# Patient Record
Sex: Female | Born: 1980 | Race: White | Hispanic: No | Marital: Married | Smoking: Former smoker
Health system: Southern US, Community
[De-identification: ages and names within clinical notes are randomized; demographics above are authoritative.]

## PROBLEM LIST (undated history)

## (undated) DIAGNOSIS — D649 Anemia, unspecified: Secondary | ICD-10-CM

## (undated) HISTORY — PX: TUBAL LIGATION: SHX77

---

## 2011-02-22 ENCOUNTER — Inpatient Hospital Stay (INDEPENDENT_AMBULATORY_CARE_PROVIDER_SITE_OTHER)
Admission: RE | Admit: 2011-02-22 | Discharge: 2011-02-22 | Disposition: A | Discharge status: Home / Self Care | Payer: Medicaid Other | Source: Ambulatory Visit | Attending: Family Medicine | Admitting: Family Medicine

## 2011-02-22 DIAGNOSIS — N390 Urinary tract infection, site not specified: Secondary | ICD-10-CM

## 2011-02-22 DIAGNOSIS — N760 Acute vaginitis: Secondary | ICD-10-CM

## 2011-02-22 LAB — WET PREP, GENITAL: Yeast Wet Prep HPF POC: NONE SEEN

## 2011-02-22 LAB — POCT PREGNANCY, URINE: Preg Test, Ur: NEGATIVE

## 2011-02-22 LAB — POCT URINALYSIS DIP (DEVICE)
Glucose, UA: 100 mg/dL — AB
Protein, ur: 30 mg/dL — AB
Urobilinogen, UA: 1 mg/dL (ref 0.0–1.0)

## 2013-04-08 ENCOUNTER — Encounter (HOSPITAL_COMMUNITY): Payer: Self-pay | Admitting: Emergency Medicine

## 2013-04-08 ENCOUNTER — Emergency Department (INDEPENDENT_AMBULATORY_CARE_PROVIDER_SITE_OTHER)
Admission: EM | Admit: 2013-04-08 | Discharge: 2013-04-08 | Disposition: A | Discharge status: Home / Self Care | Payer: Self-pay | Source: Home / Self Care

## 2013-04-08 DIAGNOSIS — K0889 Other specified disorders of teeth and supporting structures: Secondary | ICD-10-CM

## 2013-04-08 DIAGNOSIS — K089 Disorder of teeth and supporting structures, unspecified: Secondary | ICD-10-CM

## 2013-04-08 MED ORDER — CLINDAMYCIN HCL 300 MG PO CAPS: 300.0000 mg | ORAL_CAPSULE | Freq: Three times a day (TID) | ORAL | Status: DC

## 2013-04-08 MED ORDER — DICLOFENAC POTASSIUM 50 MG PO TABS: 50.0000 mg | ORAL_TABLET | Freq: Three times a day (TID) | ORAL | Status: DC

## 2013-04-08 NOTE — ED Provider Notes (Signed)
CSN: [id number]     Arrival date & time 04/08/13  1449 History   None    Chief Complaint  Patient presents with  . Jaw Pain   (Consider location/radiation/quality/duration/timing/severity/associated sxs/prior Treatment) Patient is a 32 y.o. female presenting with tooth pain. The history is provided by the patient.  Dental Pain Location:  Upper Upper teeth location:  14/LU 1st molar Quality:  Throbbing Severity:  Moderate Onset quality:  Sudden Duration:  3 years Progression:  Worsening Chronicity:  Recurrent Context: dental fracture   Associated symptoms: facial pain   Associated symptoms: no fever   Risk factors: lack of dental care     History reviewed. No pertinent past medical history. History reviewed. No pertinent past surgical history. History reviewed. No pertinent family history. History  Substance Use Topics  . Smoking status: Never Smoker   . Smokeless tobacco: Not on file  . Alcohol Use: No   OB History   Grav Para Term Preterm Abortions TAB SAB Ect Mult Living                 Review of Systems  Constitutional: Negative.  Negative for fever.  HENT: Positive for dental problem and ear pain.     Allergies  Review of patient's allergies indicates no known allergies.  Home Medications   Current Outpatient Rx  Name  Route  Sig  Dispense  Refill  . clindamycin (CLEOCIN) 300 MG capsule   Oral   Take 1 capsule (300 mg total) by mouth 3 (three) times daily.   21 capsule   0   . diclofenac (CATAFLAM) 50 MG tablet   Oral   Take 1 tablet (50 mg total) by mouth 3 (three) times daily. For dental pain   21 tablet   0    BP 114/78  Pulse 67  Temp(Src) 99 F (37.2 C) (Oral)  Resp 16  SpO2 100%  LMP 03/13/2013 Physical Exam  Nursing note and vitals reviewed. Constitutional: She is oriented to person, place, and time. She appears well-developed and well-nourished. No distress.  HENT:  Head: Normocephalic.  Right Ear: Tympanic membrane and external  ear normal.  Left Ear: Tympanic membrane and external ear normal.  Mouth/Throat: Oropharynx is clear and moist and mucous membranes are normal.    Eyes: Pupils are equal, round, and reactive to light.  Lymphadenopathy:    She has no cervical adenopathy.  Neurological: She is alert and oriented to person, place, and time.  Skin: Skin is warm and dry.    ED Course  Procedures (including critical care time) Labs Review Labs Reviewed - No data to display Imaging Review No results found.  EKG Interpretation    Date/Time:    Ventricular Rate:    PR Interval:    QRS Duration:   QT Interval:    QTC Calculation:   R Axis:     Text Interpretation:              MDM      Linna Hoff, MD 04/08/13 1657

## 2013-04-08 NOTE — ED Notes (Signed)
C/o left jaw and ear pain, having pain with opening mouth  x 3 yrs.  States 3 yrs ago had abscessed tooth. Pt thinks infection in jaw/bone.   No meds taken for treatment

## 2013-12-16 ENCOUNTER — Encounter (HOSPITAL_COMMUNITY): Payer: Self-pay | Admitting: Emergency Medicine

## 2013-12-16 ENCOUNTER — Emergency Department (HOSPITAL_COMMUNITY)
Admission: EM | Admit: 2013-12-16 | Discharge: 2013-12-16 | Disposition: A | Discharge status: Home / Self Care | Payer: Self-pay | Attending: Emergency Medicine | Admitting: Emergency Medicine

## 2013-12-16 DIAGNOSIS — R110 Nausea: Secondary | ICD-10-CM

## 2013-12-16 DIAGNOSIS — N926 Irregular menstruation, unspecified: Secondary | ICD-10-CM

## 2013-12-16 DIAGNOSIS — Z3202 Encounter for pregnancy test, result negative: Secondary | ICD-10-CM | POA: Insufficient documentation

## 2013-12-16 LAB — URINE MICROSCOPIC-ADD ON

## 2013-12-16 LAB — URINALYSIS, ROUTINE W REFLEX MICROSCOPIC
BILIRUBIN URINE: NEGATIVE
Glucose, UA: NEGATIVE mg/dL
KETONES UR: NEGATIVE mg/dL
Leukocytes, UA: NEGATIVE
NITRITE: NEGATIVE
Protein, ur: NEGATIVE mg/dL
Specific Gravity, Urine: 1.023 (ref 1.005–1.030)
UROBILINOGEN UA: 1 mg/dL (ref 0.0–1.0)
pH: 7.5 (ref 5.0–8.0)

## 2013-12-16 LAB — PREGNANCY, URINE: Preg Test, Ur: NEGATIVE

## 2013-12-16 NOTE — ED Notes (Signed)
Pt refused to ride in wheelchair to room

## 2013-12-16 NOTE — ED Notes (Signed)
Pt in stating she thinks she is pregnant, states she had her tubes tied 10 years ago- her LMP was 6/27- states that on 7/27 she spotted for 1 day, reports lower abdominal distention and pelvic pressure, intermittent nausea. States she had a positive home pregnancy test but wants to make sure it is not tubal.

## 2013-12-16 NOTE — Discharge Instructions (Signed)
Please follow-up with your gynecologist.

## 2013-12-16 NOTE — ED Provider Notes (Addendum)
CSN: [id number]     Arrival date & time 12/16/13  1944 History   First MD Initiated Contact with Patient 12/16/13 2002     Chief Complaint  Patient presents with  . Possible Pregnancy     (Consider location/radiation/quality/duration/timing/severity/associated sxs/prior Treatment) HPI 33 year old G4 P3 A1 LMP ended June presents today saying that she thinks she's pregnant. She states that she had one day of bleeding on Sunday. She feels like she has some breast tenderness, nausea and feels like she has reported she was pregnant. She denies any pain have not normal vaginal discharge or abnormal vaginal bleeding. She states that she took a home pregnancy test and felt like it was borderline positive. Patient states that she had a bilateral tubal ligation 10 years ago but thinks that she is pregnant.  History reviewed. No pertinent past medical history. History reviewed. No pertinent past surgical history. History reviewed. No pertinent family history. History  Substance Use Topics  . Smoking status: Never Smoker   . Smokeless tobacco: Not on file  . Alcohol Use: No   OB History   Grav Para Term Preterm Abortions TAB SAB Ect Mult Living                 Review of Systems  All other systems reviewed and are negative.     Allergies  Review of patient's allergies indicates no known allergies.  Home Medications   Prior to Admission medications   Not on File   BP 114/77  Pulse 77  Temp(Src) 98 F (36.7 C) (Oral)  Resp 20  Wt 120 lb (54.432 kg)  SpO2 100% Physical Exam  Nursing note and vitals reviewed. Constitutional: She is oriented to person, place, and time. She appears well-developed and well-nourished.  HENT:  Head: Normocephalic and atraumatic.  Right Ear: External ear normal.  Left Ear: External ear normal.  Nose: Nose normal.  Mouth/Throat: Oropharynx is clear and moist.  Eyes: Conjunctivae and EOM are normal. Pupils are equal, round, and reactive to light.   Neck: Normal range of motion. Neck supple.  Cardiovascular: Normal rate, regular rhythm, normal heart sounds and intact distal pulses.   Pulmonary/Chest: Effort normal and breath sounds normal.  Abdominal: Soft. Bowel sounds are normal. She exhibits no mass. There is no tenderness.  Musculoskeletal: Normal range of motion.  Neurological: She is alert and oriented to person, place, and time.  Skin: Skin is warm and dry.  Psychiatric: She has a normal mood and affect. Her behavior is normal. Judgment and thought content normal.    ED Course  Procedures (including critical care time) Labs Review Labs Reviewed  URINALYSIS, ROUTINE W REFLEX MICROSCOPIC - Abnormal; Notable for the following:    APPearance TURBID (*)    Hgb urine dipstick TRACE (*)    All other components within normal limits  URINE MICROSCOPIC-ADD ON - Abnormal; Notable for the following:    Squamous Epithelial / LPF FEW (*)    All other components within normal limits  PREGNANCY, URINE    Imaging Review No results found.   EKG Interpretation None      MDM   Final diagnoses:  Nausea  Abnormal menstrual periods    Patient with negative pregnancy test here. Patient advised to followup with gynecologist.    Hilario Quarryanielle S Authur Cubit, MD 12/19/13 1256  Hilario Quarryanielle S Adhya Cocco, MD 12/19/13 1257

## 2013-12-16 NOTE — ED Notes (Signed)
Pt discharged home with all belongings, pt refused wheel chair as originally planned, pt alert, oriented and ambulatory upon discharge. No new rx prescribed, pt driven home by boyfriend at bedside

## 2014-12-07 ENCOUNTER — Emergency Department (HOSPITAL_COMMUNITY)
Admission: EM | Admit: 2014-12-07 | Discharge: 2014-12-07 | Discharge status: Against Medical Advice | Payer: Self-pay | Attending: Emergency Medicine | Admitting: Emergency Medicine

## 2014-12-07 ENCOUNTER — Encounter (HOSPITAL_COMMUNITY): Payer: Self-pay | Admitting: *Deleted

## 2014-12-07 DIAGNOSIS — O9989 Other specified diseases and conditions complicating pregnancy, childbirth and the puerperium: Secondary | ICD-10-CM | POA: Insufficient documentation

## 2014-12-07 DIAGNOSIS — R109 Unspecified abdominal pain: Secondary | ICD-10-CM

## 2014-12-07 DIAGNOSIS — R1030 Lower abdominal pain, unspecified: Secondary | ICD-10-CM | POA: Insufficient documentation

## 2014-12-07 DIAGNOSIS — R140 Abdominal distension (gaseous): Secondary | ICD-10-CM | POA: Insufficient documentation

## 2014-12-07 DIAGNOSIS — Z3A01 Less than 8 weeks gestation of pregnancy: Secondary | ICD-10-CM | POA: Insufficient documentation

## 2014-12-07 LAB — I-STAT BETA HCG BLOOD, ED (MC, WL, AP ONLY): I-stat hCG, quantitative: <5 m[IU]/mL (ref <5)

## 2014-12-07 LAB — CBC
HEMATOCRIT: 34.6 % — AB (ref 36.0–46.0)
HEMOGLOBIN: 12 g/dL (ref 12.0–15.0)
MCH: 33 pg (ref 26.0–34.0)
MCHC: 34.7 g/dL (ref 30.0–36.0)
MCV: 95.1 fL (ref 78.0–100.0)
PLATELETS: 189 10*3/uL (ref 150–400)
RBC: 3.64 MIL/uL — ABNORMAL LOW (ref 3.87–5.11)
RDW: 13 % (ref 11.5–15.5)
WBC: 9.8 10*3/uL (ref 4.0–10.5)

## 2014-12-07 LAB — COMPREHENSIVE METABOLIC PANEL
ALBUMIN: 4.2 g/dL (ref 3.5–5.0)
ALK PHOS: 43 U/L (ref 38–126)
ALT: 16 U/L (ref 14–54)
AST: 21 U/L (ref 15–41)
Anion gap: 9 (ref 5–15)
BILIRUBIN TOTAL: 0.3 mg/dL (ref 0.3–1.2)
BUN: 7 mg/dL (ref 6–20)
CHLORIDE: 102 mmol/L (ref 101–111)
CO2: 24 mmol/L (ref 22–32)
CREATININE: 0.86 mg/dL (ref 0.44–1.00)
Calcium: 9.1 mg/dL (ref 8.9–10.3)
GFR calc non Af Amer: >60 mL/min (ref >60)
Glucose, Bld: 98 mg/dL (ref 65–99)
POTASSIUM: 3.8 mmol/L (ref 3.5–5.1)
SODIUM: 135 mmol/L (ref 135–145)
Total Protein: 6.5 g/dL (ref 6.5–8.1)

## 2014-12-07 LAB — URINALYSIS, ROUTINE W REFLEX MICROSCOPIC
Bilirubin Urine: NEGATIVE
Glucose, UA: NEGATIVE mg/dL
HGB URINE DIPSTICK: NEGATIVE
Ketones, ur: NEGATIVE mg/dL
Leukocytes, UA: NEGATIVE
NITRITE: NEGATIVE
Protein, ur: NEGATIVE mg/dL
SPECIFIC GRAVITY, URINE: 1.006 (ref 1.005–1.030)
UROBILINOGEN UA: 0.2 mg/dL (ref 0.0–1.0)
pH: 6.5 (ref 5.0–8.0)

## 2014-12-07 LAB — LIPASE, BLOOD: Lipase: 20 U/L — ABNORMAL LOW (ref 22–51)

## 2014-12-07 NOTE — ED Provider Notes (Signed)
CSN: [id number]     Arrival date & time 12/07/14  2038 History   This chart was scribed for Dierdre Forth, PA-C working with No att. providers found by Elveria Rising, ED Scribe. This patient was seen in room TR01C/TR01C and the patient's care was started at 9:43 PM.   Chief Complaint  Patient presents with  . Abdominal Pain  . Possible Pregnancy   The history is provided by the patient and medical records. No language interpreter was used.   HPI Comments: Erika Reese is a 34 y.o. female who presents to the Emergency Department complaining of lower abdominal cramping and bloating for one week now. Patient reports intermittent treatment with ibuprofen and denies any other treatment. Patient reports positive pregnancy test at home today. Patient's LNMP dated 6/01 and patient reports spotting 3-4 days ago. Patient is sexually active with single female partner, her husband. Patient denies vaginal discharge, bleeding, or dysuria. Patient reports regular bowel movements since onset of her abdominal pain, last today. Patient denies history of STDs.   History reviewed. No pertinent past medical history. History reviewed. No pertinent past surgical history. History reviewed. No pertinent family history. History  Substance Use Topics  . Smoking status: Never Smoker   . Smokeless tobacco: Not on file  . Alcohol Use: No   OB History    No data available     Review of Systems  Constitutional: Negative for fever, chills, diaphoresis, appetite change, fatigue and unexpected weight change.  HENT: Negative for mouth sores.   Eyes: Negative for visual disturbance.  Respiratory: Negative for cough, chest tightness, shortness of breath and wheezing.   Cardiovascular: Negative for chest pain.  Gastrointestinal: Positive for abdominal pain and abdominal distention. Negative for nausea, vomiting, diarrhea and constipation.  Endocrine: Negative for polydipsia, polyphagia and polyuria.   Genitourinary: Negative for dysuria, urgency, frequency, hematuria, vaginal bleeding, vaginal discharge and vaginal pain.  Musculoskeletal: Negative for back pain and neck stiffness.  Skin: Negative for rash.  Allergic/Immunologic: Negative for immunocompromised state.  Neurological: Negative for syncope, light-headedness and headaches.  Hematological: Does not bruise/bleed easily.  Psychiatric/Behavioral: Negative for sleep disturbance. The patient is not nervous/anxious.     Allergies  Review of patient's allergies indicates no known allergies.  Home Medications   Prior to Admission medications   Not on File   Triage Vitals: BP 111/63 mmHg  Pulse 77  Temp(Src) 98.2 F (36.8 C) (Oral)  Resp 18  SpO2 99%  LMP 10/18/2014 (Approximate) Physical Exam  Constitutional: She appears well-developed and well-nourished. No distress.  Awake, alert, nontoxic appearance  HENT:  Head: Normocephalic and atraumatic.  Mouth/Throat: Oropharynx is clear and moist. No oropharyngeal exudate.  Eyes: Conjunctivae are normal. No scleral icterus.  Neck: Normal range of motion. Neck supple.  Cardiovascular: Normal rate, regular rhythm, normal heart sounds and intact distal pulses.   Pulmonary/Chest: Effort normal and breath sounds normal. No respiratory distress. She has no wheezes.  Equal chest expansion  Abdominal: Soft. Bowel sounds are normal. She exhibits distension. She exhibits no mass. There is no tenderness. There is no rebound and no guarding.  Mild gaseous distension  Musculoskeletal: Normal range of motion. She exhibits no edema.  Neurological: She is alert.  Speech is clear and goal oriented Moves extremities without ataxia  Skin: Skin is warm and dry. She is not diaphoretic.  Psychiatric: She has a normal mood and affect.  Nursing note and vitals reviewed.   ED Course  Procedures (including critical care time)  COORDINATION OF CARE: 9:49 PM- Discussed treatment plan with  patient at bedside and patient agreed to plan.   Labs Review Labs Reviewed  LIPASE, BLOOD - Abnormal; Notable for the following:    Lipase 20 (*)    All other components within normal limits  CBC - Abnormal; Notable for the following:    RBC 3.64 (*)    HCT 34.6 (*)    All other components within normal limits  WET PREP, GENITAL  COMPREHENSIVE METABOLIC PANEL  URINALYSIS, ROUTINE W REFLEX MICROSCOPIC (NOT AT [facility])  I-STAT BETA HCG BLOOD, ED ([facility], [facility], [facility] ONLY)  GC/CHLAMYDIA PROBE AMP ([facility]) NOT AT [facility]    Imaging Review No results found.   EKG Interpretation None      MDM   Final diagnoses:  Lower abdominal pain  Abdominal cramping   [name] resents with complaints of abdominal tightness, bloating, cramping and positive pregnancy test today at home. Patient with negative hCG here in the emergency department. After assessment with labs pending, I recommended a KUB and pelvic exam. Patient agreed to this however eloped from the department several minutes later, before this was able to be performed.  Her abdomen was soft and nontender without guarding or rebound. Low likelihood of appendicitis, diverticulitis however I was unable to rule out PID.  BP 111/63 mmHg  Pulse 77  Temp(Src) 98.2 F (36.8 C) (Oral)  Resp 18  SpO2 99%  LMP 10/18/2014 (Approximate)   [facility], PA-C 12/07/14 2247  Benjiman Core, MD 12/07/14 [phone]

## 2014-12-07 NOTE — ED Notes (Signed)
Pt states she took a pregnancy test today that was positive. Pt reports abdominal tightness and cramping. Pt denies vaginal bleeding, discharge. Pt's abdomen is soft and non tender. LMP approximately June 1st. Pt is a gravida 2.

## 2014-12-07 NOTE — ED Notes (Signed)
In to assess pt, noted gown on the bed and pt not in room. Scribe reports pt went out to lobby to talk to her husband. Went to lobby, no response from pt. PA notified of the same.

## 2014-12-07 NOTE — ED Notes (Signed)
Pt has not returned to room and pt not in lobby. PA made aware of the same

## 2015-01-05 ENCOUNTER — Emergency Department (HOSPITAL_COMMUNITY)
Admission: EM | Admit: 2015-01-05 | Discharge: 2015-01-05 | Disposition: A | Discharge status: Home / Self Care | Payer: Self-pay | Attending: Emergency Medicine | Admitting: Emergency Medicine

## 2015-01-05 ENCOUNTER — Encounter (HOSPITAL_COMMUNITY): Payer: Self-pay | Admitting: Emergency Medicine

## 2015-01-05 ENCOUNTER — Emergency Department (HOSPITAL_COMMUNITY): Payer: Self-pay

## 2015-01-05 DIAGNOSIS — M25561 Pain in right knee: Secondary | ICD-10-CM | POA: Insufficient documentation

## 2015-01-05 MED ORDER — MELOXICAM 15 MG PO TABS: 15.0000 mg | ORAL_TABLET | Freq: Every day | ORAL | Status: DC

## 2015-01-05 NOTE — ED Provider Notes (Signed)
CSN: [id number]     Arrival date & time 01/05/15  1417 History  This chart was scribed for non-physician practitioner, Langston Masker, PA-C, working with Lorre Nick, MD, by Ronney Lion, ED Scribe. This patient was seen in room WTR7/WTR7 and the patient's care was started at 4:40 PM.    Chief Complaint  Patient presents with  . Knee Pain    The history is provided by the patient. No language interpreter was used.   HPI Comments: Erika Reese is a 34 y.o. female who presents to the Emergency Department complaining of chronic right knee pain. She denies any known recent or past injuries including twists, or falls, but patient does she state she used to be a Biochemist, clinical, and currently works at a job at [employer] that involves heavy lifting and standing for 9 hours a day. She complains of associated intermittent right knee swelling but states it is not swollen today. She elevated it, iced it, and took ibuprofen, all with no relief. Patient states every time she stands on it, she feels like it needs to pop. She states it is worse at night. She denies any problems with any other joints. She denies a history of any chronic medical conditions. She has NKDA. Patient suspects this may be a hereditary, as her father also has similar knee issues.  History reviewed. No pertinent past medical history. History reviewed. No pertinent past surgical history. No family history on file. Social History  Substance Use Topics  . Smoking status: Never Smoker   . Smokeless tobacco: None  . Alcohol Use: No   OB History    No data available     Review of Systems  Musculoskeletal: Positive for joint swelling (right knee) and arthralgias (right knee).  All other systems reviewed and are negative.  Allergies  Review of patient's allergies indicates no known allergies.  Home Medications   Prior to Admission medications   Not on File   BP 131/79 mmHg  Pulse 96  Temp(Src) 97.8 F (36.6 C) (Oral)  Resp 15   SpO2 100%  LMP 10/18/2014 (Approximate) Physical Exam  Constitutional: She is oriented to person, place, and time. She appears well-developed and well-nourished. No distress.  HENT:  Head: Normocephalic and atraumatic.  Eyes: Conjunctivae and EOM are normal.  Neck: Neck supple. No tracheal deviation present.  Cardiovascular: Normal rate.   Pulmonary/Chest: Effort normal. No respiratory distress.  Musculoskeletal: Normal range of motion. She exhibits tenderness.  Slight crepitus with ROM. FROM. Neurovascular and neurosensory. No instability.   Neurological: She is alert and oriented to person, place, and time.  Skin: Skin is warm and dry.  Psychiatric: She has a normal mood and affect. Her behavior is normal.  Nursing note and vitals reviewed.   ED Course  Procedures (including critical care time)  DIAGNOSTIC STUDIES: Oxygen Saturation is 100% on RA, norma by my interpretation.    COORDINATION OF CARE: 4:42 PM - XR results reviewed with pt. Discussed treatment plan with pt at bedside which includes placement in a knee sleeve, continuing to ice and elevate it, and referral to orthopedist for further evaluation. Pt verbalized understanding and agreed to plan.   Imaging Review Dg Knee Complete 4 Views Right  01/05/2015   CLINICAL DATA:  RIGHT knee pain. Ongoing symptoms for several months. Anterior knee pain.  EXAM: RIGHT KNEE - COMPLETE 4+ VIEW  COMPARISON:  None.  FINDINGS: There is no evidence of fracture, dislocation, or joint effusion. There is no evidence of arthropathy  or other focal bone abnormality. Soft tissues are unremarkable.  IMPRESSION: Negative.   Electronically Signed   By: Andreas Newport M.D.   On: 01/05/2015 16:02   I have personally reviewed and evaluated these images and lab results as part of my medical decision-making.  MDM   Final diagnoses:  Knee pain, right    Knee sleeve mobic   I personally performed the services in this documentation, which was  scribed in my presence.  The recorded information has been reviewed and considered.   Barnet Pall.   Lonia Skinner [place], PA-C 01/05/15 2020  Pricilla Loveless, MD 01/06/15 [phone]

## 2015-01-05 NOTE — Discharge Instructions (Signed)

## 2015-01-05 NOTE — ED Notes (Signed)
Pt states that she has right knee pain, swelling and feeling of "knee needing to pop when i stand".  Pt states that he dad has similar problem and told her to elevate and ice knee. Pt states that she has been elevating and icing right knee. Pt states that she works for [employer] and does heavy lifting.

## 2015-03-24 ENCOUNTER — Emergency Department (HOSPITAL_COMMUNITY)
Admission: EM | Admit: 2015-03-24 | Discharge: 2015-03-24 | Disposition: A | Discharge status: Home / Self Care | Payer: Self-pay | Attending: Emergency Medicine | Admitting: Emergency Medicine

## 2015-03-24 ENCOUNTER — Emergency Department (HOSPITAL_COMMUNITY): Payer: Self-pay

## 2015-03-24 ENCOUNTER — Encounter (HOSPITAL_COMMUNITY): Payer: Self-pay | Admitting: *Deleted

## 2015-03-24 DIAGNOSIS — Z791 Long term (current) use of non-steroidal anti-inflammatories (NSAID): Secondary | ICD-10-CM | POA: Insufficient documentation

## 2015-03-24 DIAGNOSIS — J069 Acute upper respiratory infection, unspecified: Secondary | ICD-10-CM | POA: Insufficient documentation

## 2015-03-24 DIAGNOSIS — B9789 Other viral agents as the cause of diseases classified elsewhere: Secondary | ICD-10-CM

## 2015-03-24 DIAGNOSIS — J40 Bronchitis, not specified as acute or chronic: Secondary | ICD-10-CM | POA: Insufficient documentation

## 2015-03-24 DIAGNOSIS — R0602 Shortness of breath: Secondary | ICD-10-CM | POA: Insufficient documentation

## 2015-03-24 DIAGNOSIS — R0789 Other chest pain: Secondary | ICD-10-CM | POA: Insufficient documentation

## 2015-03-24 MED ORDER — METHOCARBAMOL 500 MG PO TABS: 500.0000 mg | ORAL_TABLET | Freq: Two times a day (BID) | ORAL | Status: DC

## 2015-03-24 MED ORDER — HYDROCODONE-HOMATROPINE 5-1.5 MG/5ML PO SYRP
5.0000 mL | ORAL_SOLUTION | Freq: Once | ORAL | Status: AC
  Administered 2015-03-24: 5 mL via ORAL
  Filled 2015-03-24: qty 5

## 2015-03-24 MED ORDER — BENZONATATE 100 MG PO CAPS: 100.0000 mg | ORAL_CAPSULE | Freq: Three times a day (TID) | ORAL | Status: DC

## 2015-03-24 MED ORDER — METHOCARBAMOL 500 MG PO TABS
500.0000 mg | ORAL_TABLET | Freq: Once | ORAL | Status: AC
  Administered 2015-03-24: 500 mg via ORAL
  Filled 2015-03-24: qty 1

## 2015-03-24 MED ORDER — IBUPROFEN 800 MG PO TABS: 800.0000 mg | ORAL_TABLET | Freq: Three times a day (TID) | ORAL | Status: DC

## 2015-03-24 NOTE — ED Provider Notes (Signed)
CSN: [id number]     Arrival date & time 03/24/15  1036 History  By signing my name below, I, Placido Sou, attest that this documentation has been prepared under the direction and in the presence of [employer], PA-C. Electronically Signed: Placido Sou, ED Scribe. 03/24/2015. 11:30 AM.   Chief Complaint  Patient presents with  . Cough   The history is provided by the patient and medical records. No language interpreter was used.    HPI Comments: Rakisha Pincock is a 34 y.o. female who presents to the Emergency Department complaining of constant, moderate, productive cough with onset 5 days ago. She notes some associated, rhinorrhea, sore throat, green/yellow sputum produced when coughing, CP that worsens when coughing and some mild SOB. Pt notes a hx of smoking and averaging 1 pack per week. Pt notes having been taking ibuprofen for pain management which has provided little relief. She denies a pmhx of cardiac issues noting a fmhx of cardiac issues with two cousins who passed before the age of 97 but is unsure of the specific cause. She denies ear pain.   History reviewed. No pertinent past medical history. History reviewed. No pertinent past surgical history. History reviewed. No pertinent family history. Social History  Substance Use Topics  . Smoking status: Never Smoker   . Smokeless tobacco: None  . Alcohol Use: No   OB History    No data available     Review of Systems  Constitutional: Negative for fever and chills.  HENT: Positive for congestion, rhinorrhea and sore throat. Negative for ear pain.   Respiratory: Positive for cough and shortness of breath.   Cardiovascular: Positive for chest pain.   Allergies  Review of patient's allergies indicates no known allergies.  Home Medications   Prior to Admission medications   Medication Sig Start Date End Date Taking? Authorizing Provider  benzonatate (TESSALON) 100 MG capsule Take 1 capsule (100 mg total) by  mouth every 8 (eight) hours. 03/24/15   Stace Peace, PA-C  ibuprofen (ADVIL,MOTRIN) 800 MG tablet Take 1 tablet (800 mg total) by mouth 3 (three) times daily. 03/24/15   Amy Belloso, PA-C  meloxicam (MOBIC) 15 MG tablet Take 1 tablet (15 mg total) by mouth daily. 01/05/15   Elson Areas, PA-C  methocarbamol (ROBAXIN) 500 MG tablet Take 1 tablet (500 mg total) by mouth 2 (two) times daily. 03/24/15   Chyler Creely, PA-C   BP 137/77 mmHg  Pulse 96  Temp(Src) 97.7 F (36.5 C) (Oral)  Resp 18  SpO2 100%  LMP 03/16/2015 Physical Exam  Constitutional: She is oriented to person, place, and time. She appears well-developed and well-nourished. No distress.  HENT:  Head: Normocephalic and atraumatic.  Right Ear: Tympanic membrane, external ear and ear canal normal.  Left Ear: Tympanic membrane, external ear and ear canal normal.  Nose: Mucosal edema and rhinorrhea present. No epistaxis. Right sinus exhibits no maxillary sinus tenderness and no frontal sinus tenderness. Left sinus exhibits no maxillary sinus tenderness and no frontal sinus tenderness.  Mouth/Throat: Uvula is midline and mucous membranes are normal. Mucous membranes are not pale and not cyanotic. No oropharyngeal exudate, posterior oropharyngeal edema, posterior oropharyngeal erythema or tonsillar abscesses.  Eyes: Conjunctivae are normal. Pupils are equal, round, and reactive to light.  Neck: Normal range of motion and full passive range of motion without pain.  Cardiovascular: Normal rate and intact distal pulses.   Pulmonary/Chest: Effort normal and breath sounds normal. No stridor.  Clear and equal breath  sounds without focal wheezes, rhonchi, rales  Abdominal: Soft. Bowel sounds are normal. There is no tenderness.  Musculoskeletal: Normal range of motion.  Lymphadenopathy:    She has no cervical adenopathy.  Neurological: She is alert and oriented to person, place, and time.  Skin: Skin is warm and dry. No  rash noted. She is not diaphoretic.  Psychiatric: She has a normal mood and affect.  Nursing note and vitals reviewed.  ED Course  Procedures  DIAGNOSTIC STUDIES: Oxygen Saturation is 100% on RA, normal by my interpretation.    COORDINATION OF CARE: 11:19 AM Pt presents today with multiple cold-like symptoms as well as CP and SOB. Discussed treatment plan with pt at bedside including a CXR, an EKG, 1x muscle relaxers, 1x hydrocodone and reevaluation based on results. Pt agreed to plan.  Labs Review Labs Reviewed - No data to display  Imaging Review Dg Chest 2 View  03/24/2015  CLINICAL DATA:  34 year old female with a history of productive cough EXAM: CHEST - 2 VIEW COMPARISON:  None. FINDINGS: Cardiomediastinal silhouette projects within normal limits in size and contour. No confluent airspace disease, pneumothorax, or pleural effusion. No displaced fracture. Unremarkable appearance of the upper abdomen. IMPRESSION: No radiographic evidence of acute cardiopulmonary disease. Signed, Yvone NeuJaime S. Loreta AveWagner, DO [facility] Electronically Signed   By: Gilmer MorJaime  Wagner D.O.   On: 03/24/2015 11:38   I have personally reviewed and evaluated these images as part of my medical decision-making.   EKG Interpretation   Date/Time:  Saturday March 24 2015 11:55:19 EDT Ventricular Rate:  62 PR Interval:  148 QRS Duration: 84 QT Interval:  416 QTC Calculation: 422 R Axis:   85 Text Interpretation:  Normal sinus rhythm with sinus arrhythmia Normal ECG  No previous ECGs available Confirmed by ZACKOWSKI  MD, SCOTT [phone]([id number]) on  03/24/2015 12:01:34 PM      MDM   Final diagnoses:  Viral URI with cough  Other chest pain  Bronchitis   [place] Kackley presents with URI symptoms and c/o chest pain.  Pt CXR negative for acute infiltrate. Patients symptoms are consistent with URI, likely viral etiology. EKG is nonischemic. Highly doubt ACS based on  atypical presentation.  Chest pain is reproducible both with palpation and coughing.  No risk factors for PE. No history of PE.  PERC negative.  Discussed that antibiotics are not indicated for viral infections. Pt will be discharged with symptomatic treatment.  Verbalizes understanding and is agreeable with plan. Pt is hemodynamically stable & in NAD prior to dc.  BP 137/77 mmHg  Pulse 96  Temp(Src) 97.7 F (36.5 C) (Oral)  Resp 18  SpO2 100%  LMP 03/16/2015    Dierdre ForthHannah Bates [place], PA-C 03/24/15 1225  Vanetta MuldersScott Zackowski, MD 03/25/15 [phone]

## 2015-03-24 NOTE — ED Notes (Signed)
Pt reports cough and cold symptoms x 4-5 days. Cough is productive with yellow/green sputum.

## 2015-03-24 NOTE — Discharge Instructions (Signed)
1. Medications: ibuprofen, robaxin, mucinex, tessalon, usual home medications 2. Treatment: rest, drink plenty of fluids, take tylenol or ibuprofen for fever control 3. Follow Up: Please followup with your primary doctor in 3 days for discussion of your diagnoses and further evaluation after today's visit; if you do not have a primary care doctor use the resource guide provided to find one; Return to the ER for high fevers, difficulty breathing or other concerning symptoms   Chest Wall Pain Chest wall pain is pain in or around the bones and muscles of your chest. Sometimes, an injury causes this pain. Sometimes, the cause may not be known. This pain may take several weeks or longer to get better. HOME CARE INSTRUCTIONS  Pay attention to any changes in your symptoms. Take these actions to help with your pain:   Rest as told by your health care provider.   Avoid activities that cause pain. These include any activities that use your chest muscles or your abdominal and side muscles to lift heavy items.   If directed, apply ice to the painful area:  Put ice in a plastic bag.  Place a towel between your skin and the bag.  Leave the ice on for 20 minutes, 2-3 times per day.  Take over-the-counter and prescription medicines only as told by your health care provider.  Do not use tobacco products, including cigarettes, chewing tobacco, and e-cigarettes. If you need help quitting, ask your health care provider.  Keep all follow-up visits as told by your health care provider. This is important. SEEK MEDICAL CARE IF:  You have a fever.  Your chest pain becomes worse.  You have new symptoms. SEEK IMMEDIATE MEDICAL CARE IF:  You have nausea or vomiting.  You feel sweaty or light-headed.  You have a cough with phlegm (sputum) or you cough up blood.  You develop shortness of breath.   This information is not intended to replace advice given to you by your health care provider. Make sure  you discuss any questions you have with your health care provider.   Document Released: 05/05/2005 Document Revised: 01/24/2015 Document Reviewed: 07/31/2014 Elsevier Interactive Patient Education 2016 Elsevier Inc.   Acute Bronchitis Bronchitis is inflammation of the airways that extend from the windpipe into the lungs (bronchi). The inflammation often causes mucus to develop. This leads to a cough, which is the most common symptom of bronchitis.  In acute bronchitis, the condition usually develops suddenly and goes away over time, usually in a couple weeks. Smoking, allergies, and asthma can make bronchitis worse. Repeated episodes of bronchitis may cause further lung problems.  CAUSES Acute bronchitis is most often caused by the same virus that causes a cold. The virus can spread from person to person (contagious) through coughing, sneezing, and touching contaminated objects. SIGNS AND SYMPTOMS   Cough.   Fever.   Coughing up mucus.   Body aches.   Chest congestion.   Chills.   Shortness of breath.   Sore throat.  DIAGNOSIS  Acute bronchitis is usually diagnosed through a physical exam. Your health care provider will also ask you questions about your medical history. Tests, such as chest X-rays, are sometimes done to rule out other conditions.  TREATMENT  Acute bronchitis usually goes away in a couple weeks. Oftentimes, no medical treatment is necessary. Medicines are sometimes given for relief of fever or cough. Antibiotic medicines are usually not needed but may be prescribed in certain situations. In some cases, an inhaler may be recommended  to help reduce shortness of breath and control the cough. A cool mist vaporizer may also be used to help thin bronchial secretions and make it easier to clear the chest.  HOME CARE INSTRUCTIONS  Get plenty of rest.   Drink enough fluids to keep your urine clear or pale yellow (unless you have a medical condition that requires  fluid restriction). Increasing fluids may help thin your respiratory secretions (sputum) and reduce chest congestion, and it will prevent dehydration.   Take medicines only as directed by your health care provider.  If you were prescribed an antibiotic medicine, finish it all even if you start to feel better.  Avoid smoking and secondhand smoke. Exposure to cigarette smoke or irritating chemicals will make bronchitis worse. If you are a smoker, consider using nicotine gum or skin patches to help control withdrawal symptoms. Quitting smoking will help your lungs heal faster.   Reduce the chances of another bout of acute bronchitis by washing your hands frequently, avoiding people with cold symptoms, and trying not to touch your hands to your mouth, nose, or eyes.   Keep all follow-up visits as directed by your health care provider.  SEEK MEDICAL CARE IF: Your symptoms do not improve after 1 week of treatment.  SEEK IMMEDIATE MEDICAL CARE IF:  You develop an increased fever or chills.   You have chest pain.   You have severe shortness of breath.  You have bloody sputum.   You develop dehydration.  You faint or repeatedly feel like you are going to pass out.  You develop repeated vomiting.  You develop a severe headache. MAKE SURE YOU:   Understand these instructions.  Will watch your condition.  Will get help right away if you are not doing well or get worse.   This information is not intended to replace advice given to you by your health care provider. Make sure you discuss any questions you have with your health care provider.   Document Released: 06/12/2004 Document Revised: 05/26/2014 Document Reviewed: 10/26/2012 Elsevier Interactive Patient Education 2016 [employer]    Emergency Department Resource Guide 1) Find a Doctor and Pay Out of Pocket Although you won't have to find out who is covered by your insurance plan, it is a good idea to ask around and get  recommendations. You will then need to call the office and see if the doctor you have chosen will accept you as a new patient and what types of options they offer for patients who are self-pay. Some doctors offer discounts or will set up payment plans for their patients who do not have insurance, but you will need to ask so you aren't surprised when you get to your appointment.  2) Contact Your Local Health Department Not all health departments have doctors that can see patients for sick visits, but many do, so it is worth a call to see if yours does. If you don't know where your local health department is, you can check in your phone book. The CDC also has a tool to help you locate your state's health department, and many state websites also have listings of all of their local health departments.  3) Find a Walk-in Clinic If your illness is not likely to be very severe or complicated, you may want to try a walk in clinic. These are popping up all over the country in pharmacies, drugstores, and shopping centers. They're usually staffed by nurse practitioners or physician assistants that have been trained to  treat common illnesses and complaints. They're usually fairly quick and inexpensive. However, if you have serious medical issues or chronic medical problems, these are probably not your best option.  No Primary Care Doctor: - Call Health Connect at  [phone] - they can help you locate a primary care doctor that  accepts your insurance, provides certain services, etc. - Physician Referral Service- [phone]  Chronic Pain Problems: Organization         Address  Phone   Notes  [facility]  [phone] Patients need to be referred by their primary care doctor.   Medication Assistance: Organization         Address  Phone   Notes  [facility] [address] [place] [place], [place] [address] [phone] --Must be a resident of  [facility] -- Must have NO insurance coverage whatsoever (no Medicaid/ Medicare, etc.) -- The pt. MUST have a primary care doctor that directs their care regularly and follows them in the community   [facility]  [phone]   [employer]  [phone]    Agencies that provide inexpensive medical care: Organization         Address  Phone   Notes  [facility]  [phone]   [facility]    [phone]   [facility] [address] [place], [place] [address] [phone]   [facility] [address] [place] [place] [phone]   Planned Parenthood    [phone]   [facility]    [phone]   [facility]  [address], [place] Phone:  [phone], Fax:  [phone] Hours of Operation:  9 am - 6 pm, M-F.  Also accepts Medicaid/Medicare and self-pay.  [facility]  [address], [place] Phone: [phone], Fax: [phone]. Hours of Operation:  8:30 am - 5:30 pm, M-F.  Also accepts Medicaid and self-pay.  [facility] [place] [address] [place] Phone: [phone]   [facility] [address] Natasha Bence [place], [place] [phone], Ext. [phone] Mondays & Thursdays: 7-9 AM.  First 15 patients are seen on a first come, first serve basis.    Medicaid-accepting [facility] Providers:  Organization         Address  Phone   Notes  [facility] [address], [place] [phone] Also accepts self-pay patients.  [facility] [address] Laurell Josephs [place], [place]  [phone]   [facility] [address], [place] [phone]   [facility] [address], [place] [phone]   Renaye Rakers [address], [place]   [phone] Only accepts [place] Access  [place] patients after they have their name applied to their card.   Self-Pay (no insurance) in [facility]:  Organization         Address  Phone   Notes  Sickle Cell Patients, [facility] [address] [place], [place] [phone]   [facility] [address] [place], [place] [phone]   [facility] [place]  [address], [place] [phone]   [facility]/Dr. Osei-Bonsu  [address] [place] or [address], [place] [phone] Phone number for both [facility] and [place] locations is the same.  Urgent Medical and [facility] [address], [place] [phone]   [facility] [address] [place] or [address] [phone]   [facility] [address] [place] [phone], phone; [phone], fax Sees patients 1st and 3rd Saturday of every month.  Must not qualify for public or private insurance (i.e. Medicaid, Medicare, [place] Health Choice, Veterans' Benefits)  Household income should be no more than 200% of the poverty level The clinic cannot treat you if you are pregnant or think you are pregnant  Sexually transmitted diseases are not treated at the clinic.    Dental Care: Organization         Address  Phone  Notes  [facility] [address] [place], [place] [phone] Accepts children up to age 70 who are enrolled in [place] or [place] Health Choice; pregnant women with a Medicaid card; and children who have applied for Medicaid or [place] Health Choice, but were declined, whose parents can pay a reduced fee at time of service.  [facility] Department of [facility]  [address], [place] [phone] Accepts children up to age 74 who are enrolled in [place] or [place] Health Choice; pregnant women with a Medicaid  card; and children who have applied for Medicaid or [place] Health Choice, but were declined, whose parents can pay a reduced fee at time of service.  [facility]  [address] [place], [place] [phone] Patients are seen by appointment only. Walk-ins are not accepted. [facility] will see patients 54 years of age and older. Monday - Tuesday (8am-5pm) Most Wednesdays (8:30-5pm) $30 per visit, cash only  [facility]  [address], [facility] [phone] Patients are seen by appointment only. Walk-ins are not accepted. [facility] will see patients 93 years of age and older. One Wednesday Evening (Monthly: Volunteer Based).  $30 per visit, cash only  [employer]  [phone] for adults; Children under age 71, call Graduate Pediatric Dentistry at [phone]. Children aged 76-14, please call [phone] to request a pediatric application.  Dental services are provided in all areas of dental care including fillings, crowns and bridges, complete and partial dentures, implants, gum treatment, root canals, and extractions. Preventive care is also provided. Treatment is provided to both adults and children. Patients are selected via a lottery and there is often a waiting list.   [facility] [address] [place]  [phone] [email or web address]   [facility] [address] [place], [place] [phone], Ext. [phone] Second and Fourth Thursday of each month, opens at 6:30 AM; Clinic ends at 9 AM.  Patients are seen on a first-come first-served basis, and a limited number are seen during each clinic.   [facility]  [address] Ether Griffins [place], [place] [phone]   Eligibility Requirements You must have lived in [place], [place], or [place] counties for at least the last three months.   You cannot be eligible for state or federal sponsored [place],  including [employer], [place], or [employer].   You generally cannot be eligible for healthcare insurance through your employer.    How to apply: Eligibility screenings are held every  Tuesday and Wednesday afternoon from 1:00 pm until 4:00 pm. You do not need an appointment for the interview!  [facility] [address], [place], [place] [phone]   [facility] Health Department  [phone]   [facility] Health Department  [phone]   [facility] Health Department  [phone]    Behavioral Health Resources in the Community: Intensive Outpatient Programs Organization         Address  Phone  Notes  [facility] [address] [place], [place] [phone]   [facility] Outpatient [address] [place], [place] [phone]   [facility] [address], [place], [place]  [phone]   [facility] [address]  [place], [place] [phone] or [phone]   Substance Abuse Resources Organization         Address  Phone  Notes  [facility] Drug Services  [phone]   [facility]  [phone]   [facility] [place]  [phone]   Floydene Flock  [phone]   Residential & Outpatient Substance Abuse Program  [phone]   Psychological Services Organization         Address  Phone  Notes  [facility]  [phone]   [facility]  [phone]   [facility] [address], [place] [phone] or [phone]    Mobile Crisis Teams Organization         Address  Phone  Notes  [facility], Mobile Crisis Care Unit  [phone]   [facility]  [address] [place], [place] [phone]   Doristine Locks [address] [place] [phone]    Self-Help/Support Groups Organization         Address  Phone             Notes  [facility]oc.  of [place] - variety of support groups  336- I7437963 Call for more information  Narcotics Anonymous (NA), [facility] [address], [employer] [place]  2 meetings at this location   Statistician         Address  Phone  Notes  [facility] [address],    [place]  [phone]   [facility]  [address], [place] [address], [place], [place] [phone]   [facility] [address] [place], [place] [phone] Admissions: 8am-3pm M-F  [facility] [address],    [place], [place] [phone]   [facility] [address] [place], [place], [place] [phone]   [facility] [address]  [place], [place] [phone]   [facility] - Intensive Outpatient [address] Laurell Joseph[address], [place], [place] [phone]   [facility] ([facility] [address] [place]  [place], [place] [phone] or [phone]   [facility] ([facility]) [address], [place], [place] [phone] Accepts Medicaid  [facility] [address],  [place] [phone] Substance Abuse/Addiction Treatment   [facility] Organization         Address  Phone  Notes  [facility]  [phone]   Angie Fava, PhD [address] Ervin Knack [place], [place]   [phone] or [phone]   [facility] Behavioral   [address] [place], [place] [phone]   [facility] [address] [place], [place] [phone] Insurance/Medicaid/sponsorship through [facility]  [facility] [address]                                    [place], [place] [phone] Therapy/tele-psych/case  [facility] [address]   [place], [place] [phone]    Dr. Lolly Mustache  [phone]   [facility] [place]  [employer] [facility] Dept. 1) [address] [place] 2)  [address] [place] 3)  [address], [place] [phone]  [phone]   [facility] Child Abuse Hotline [phone] or [phone] (After Hours)

## 2015-04-09 ENCOUNTER — Encounter (HOSPITAL_COMMUNITY): Payer: Self-pay | Admitting: Emergency Medicine

## 2015-04-09 ENCOUNTER — Emergency Department (HOSPITAL_COMMUNITY)
Admission: EM | Admit: 2015-04-09 | Discharge: 2015-04-09 | Disposition: A | Discharge status: Home / Self Care | Payer: Self-pay | Attending: Emergency Medicine | Admitting: Emergency Medicine

## 2015-04-09 DIAGNOSIS — F17210 Nicotine dependence, cigarettes, uncomplicated: Secondary | ICD-10-CM | POA: Insufficient documentation

## 2015-04-09 DIAGNOSIS — Z79899 Other long term (current) drug therapy: Secondary | ICD-10-CM | POA: Insufficient documentation

## 2015-04-09 DIAGNOSIS — K089 Disorder of teeth and supporting structures, unspecified: Secondary | ICD-10-CM | POA: Insufficient documentation

## 2015-04-09 DIAGNOSIS — K029 Dental caries, unspecified: Secondary | ICD-10-CM | POA: Insufficient documentation

## 2015-04-09 MED ORDER — TRAMADOL HCL 50 MG PO TABS: 50.0000 mg | ORAL_TABLET | Freq: Four times a day (QID) | ORAL | Status: DC | PRN

## 2015-04-09 MED ORDER — AMOXICILLIN 500 MG PO CAPS: 500.0000 mg | ORAL_CAPSULE | Freq: Three times a day (TID) | ORAL | Status: DC

## 2015-04-09 MED ORDER — BUPIVACAINE-EPINEPHRINE (PF) 0.5% -1:200000 IJ SOLN
1.8000 mL | Freq: Once | INTRAMUSCULAR | Status: AC
  Administered 2015-04-09: 1.8 mL

## 2015-04-09 MED ORDER — AMOXICILLIN 500 MG PO CAPS
500.0000 mg | ORAL_CAPSULE | Freq: Once | ORAL | Status: AC
  Administered 2015-04-09: 500 mg via ORAL
  Filled 2015-04-09: qty 1

## 2015-04-09 NOTE — Discharge Instructions (Signed)
Take percocet for breakthrough pain, do not drink alcohol, drive, care for children or do other critical tasks while taking percocet. For pain control you may take:  800mg  of ibuprofen (that is usually 4 over the counter pills)  3 times a day (take with food) and acetaminophen 975mg  (this is 3 over the counter pills) four times a day. Do not drink alcohol or combine with other medications that have acetaminophen as an ingredient (Read the labels!).  For breakthrough pain you may take Tramadol. Do not drink alcohol drive or operate heavy machinery when taking Tramadol.  Return to the emergency room for fever, change in vision, redness to the face that rapidly spreads towards the eye, nausea or vomiting, difficulty swallowing or shortness of breath.   Apply warm compresses to jaw throughout the day.   Take your antibiotics as directed and to the end of the course.   Followup with a dentist is very important for ongoing evaluation and management of recurrent dental pain. Return to emergency department for emergent changing or worsening symptoms.  Low-cost dental clinic: Yancey Flemings**David  Civils  at [phone]**   You may also call [phone]  Dental Assistance If the dentist on-call cannot see you, please use the resources below:   Patients with Medicaid: [facility] [phone], [phone] [address], [phone]  If unable to pay, or uninsured, contact [facility] [phone]) or [facility] [phone] in [place], [phone] in [facility]) to become qualified for the adult dental clinic  Other Low-Cost Community Dental Services: [facility] [address], [place], [place], [phone]    [phone], Ext. [phone]    2nd and 4th Thursday of the month at 6:30am    10 clients each day by appointment, can sometimes see walk-in     patients if someone does not show for an appointment [facility]- [address], [place],  [place], [phone]    [phone] [facility]- [address], [place], [place], [phone]    [phone]  [facility]- [phone] [facility]- [phone] [facility]- [phone]

## 2015-04-09 NOTE — ED Provider Notes (Signed)
CSN: [id number]     Arrival date & time 04/09/15  [id number] History  By signing my name below, I, Erika Reese, attest that this documentation has been prepared under the direction and in the presence of [employer] Corporation[employer]ole Willadean Guyton, PA-C. Electronically Signed: Lyndel SafeKaitlyn Reese, ED Scribe. 04/09/2015. 9:18 AM.   Chief Complaint  Patient presents with  . Dental Pain   The history is provided by the patient. No language interpreter was used.   HPI Comments: Erika Reese is a 34 y.o. female, with no pertinent PMhx, who presents to the Emergency Department complaining of constant, severe right lower dental pain onset 2-3 days ago. She has been taking 'a lot' of ibuprofen without significant relief. Pt has attempted to make an appointment with a dentist but does not have the monetary means or insurance to pay for the appointment at this time. She was advised by the dentist office she called to be evaluated for an infection and to be started on antibiotics if warranted before the tooth can be extracted. She plans to follow up with this dentist office once she is able to afford the procedure. Denies fevers and chills. No facial swelling. Pt tolerating secretions.   History reviewed. No pertinent past medical history. History reviewed. No pertinent past surgical history. No family history on file. Social History  Substance Use Topics  . Smoking status: Current Every Day Smoker -- 0.25 packs/day  . Smokeless tobacco: None  . Alcohol Use: Yes   OB History    No data available     Review of Systems  A complete 10 system review of systems was obtained and is otherwise negative except at noted in the HPI and PMH.  Allergies  Review of patient's allergies indicates no known allergies.  Home Medications   Prior to Admission medications   Medication Sig Start Date End Date Taking? Authorizing Provider  benzonatate (TESSALON) 100 MG capsule Take 1 capsule (100 mg total) by mouth every 8 (eight) hours.  03/24/15   Erika Muthersbaugh, PA-C  ibuprofen (ADVIL,MOTRIN) 800 MG tablet Take 1 tablet (800 mg total) by mouth 3 (three) times daily. 03/24/15   Erika Muthersbaugh, PA-C  meloxicam (MOBIC) 15 MG tablet Take 1 tablet (15 mg total) by mouth daily. 01/05/15   Erika AreasLeslie K Sofia, PA-C  methocarbamol (ROBAXIN) 500 MG tablet Take 1 tablet (500 mg total) by mouth 2 (two) times daily. 03/24/15   Erika Muthersbaugh, PA-C   BP 120/78 mmHg  Pulse 63  Temp(Src) 98.3 F (36.8 C) (Oral)  Resp 18  Ht 5\' 4"  (1.626 m)  Wt 120 lb (54.432 kg)  BMI 20.59 kg/m2  SpO2 100%  LMP 03/16/2015 Physical Exam  Constitutional: She is oriented to person, place, and time. She appears well-developed and well-nourished. No distress.  HENT:  Head: Normocephalic.  Mouth/Throat: Uvula is midline and oropharynx is clear and moist. No trismus in the jaw. No uvula swelling. No oropharyngeal exudate, posterior oropharyngeal edema, posterior oropharyngeal erythema or tonsillar abscesses.    Generally poor dentition, no gingival swelling, erythema or tenderness to palpation. Patient is handling their secretions. There is no tenderness to palpation or firmness underneath tongue bilaterally. No trismus.    Eyes: Conjunctivae and EOM are normal.  Neck: Normal range of motion. Neck supple.  Cardiovascular: Normal rate.   Pulmonary/Chest: Effort normal. No stridor. No respiratory distress.  Musculoskeletal: Normal range of motion.  Lymphadenopathy:    She has no cervical adenopathy.  Neurological: She is alert and oriented to person, place,  and time. Coordination normal.  Skin: Skin is warm.  Psychiatric: She has a normal mood and affect. Her behavior is normal.  Nursing note and vitals reviewed.  ED Course  .Nerve Block Date/Time: 04/09/2015 9:20 AM Performed by: Erika Reese Authorized by: Erika Reese Consent: Verbal consent obtained. Risks and benefits: risks, benefits and alternatives were discussed Consent  given by: patient Required items: required blood products, implants, devices, and special equipment available Patient identity confirmed: verbally with patient Time out: Immediately prior to procedure a "time out" was called to verify the correct patient, procedure, equipment, support staff and site/side marked as required. Indications: pain relief Body area: face/mouth Nerve: inferior alveolar Laterality: right Patient sedated: no Patient position: sitting Needle gauge: 27 G Location technique: anatomical landmarks Anesthetic total: 1.8 ml Outcome: pain improved Patient tolerance: Patient tolerated the procedure well with no immediate complications  Dental Date/Time: 04/09/2015 9:22 AM Performed by: Erika Reese Authorized by: Erika Reese Consent: Verbal consent obtained. Risks and benefits: risks, benefits and alternatives were discussed Consent given by: patient Required items: required blood products, implants, devices, and special equipment available Patient identity confirmed: verbally with patient Patient sedated: no Patient tolerance: Patient tolerated the procedure well with no immediate complications Comments: Dental cement applied over carries/fracture to protect the nerve and ease pain    DIAGNOSTIC STUDIES: Oxygen Saturation is 100% on RA, normal by my interpretation.    COORDINATION OF CARE: 9:09 AM Discussed treatment plan with pt at bedside which includes to perform a dental block using marcaine and prescribe an amoxicillin course. Pt agreed to plan.  MDM   Final diagnoses:  Pain due to dental caries    Filed Vitals:   04/09/15 0905  BP: 120/78  Pulse: 63  Temp: 98.3 F (36.8 C)  TempSrc: Oral  Resp: 18  Height:  (1.626 m)  Weight: 120 lb (54.432 kg)  SpO2: 100%    Medications  bupivacaine-epinephrine (MARCAINE W/ EPI) 0.5% -1:200000 injection 1.8 mL (not administered)  amoxicillin (AMOXIL) capsule 500 mg (not administered)     Erika Reese is 34 y.o. female presenting with dental caries and dental pain. Nerve block given and tooth is coated in dental cement to protect the nerve in his pain.   Patient afebrile, non toxic appearing and swallowing secretions well. I gave patient referral to dentist and stressed the importance of dental follow up for definitive management of dental issues. Patient voices understanding and is agreeable to plan.  Evaluation does not show pathology that would require ongoing emergent intervention or inpatient treatment. Pt is hemodynamically stable and mentating appropriately. Discussed findings and plan with patient/guardian, who agrees with care plan. All questions answered. Return precautions discussed and outpatient follow up given.   New Prescriptions   AMOXICILLIN (AMOXIL) 500 MG CAPSULE    Take 1 capsule (500 mg total) by mouth 3 (three) times daily.   TRAMADOL (ULTRAM) 50 MG TABLET    Take 1 tablet (50 mg total) by mouth every 6 (six) hours as needed.     I personally performed the services described in this documentation, which was scribed in my presence. The recorded information has been reviewed and is accurate.    Erika Emery, PA-C 04/09/15 9604  Melene Plan, DO 04/09/15 1035

## 2015-04-09 NOTE — ED Notes (Signed)
Patient states right lower dental pain x 2 month when she broke a tooth.  Patient states no insurance and has not been to dentist.

## 2015-07-15 ENCOUNTER — Encounter (HOSPITAL_COMMUNITY): Payer: Self-pay | Admitting: Emergency Medicine

## 2015-07-15 ENCOUNTER — Emergency Department (HOSPITAL_COMMUNITY)
Admission: EM | Admit: 2015-07-15 | Discharge: 2015-07-15 | Disposition: A | Discharge status: Home / Self Care | Payer: Self-pay | Attending: Emergency Medicine | Admitting: Emergency Medicine

## 2015-07-15 DIAGNOSIS — Z791 Long term (current) use of non-steroidal anti-inflammatories (NSAID): Secondary | ICD-10-CM | POA: Insufficient documentation

## 2015-07-15 DIAGNOSIS — Z79899 Other long term (current) drug therapy: Secondary | ICD-10-CM | POA: Insufficient documentation

## 2015-07-15 DIAGNOSIS — K029 Dental caries, unspecified: Secondary | ICD-10-CM

## 2015-07-15 DIAGNOSIS — F17200 Nicotine dependence, unspecified, uncomplicated: Secondary | ICD-10-CM | POA: Insufficient documentation

## 2015-07-15 DIAGNOSIS — Z792 Long term (current) use of antibiotics: Secondary | ICD-10-CM | POA: Insufficient documentation

## 2015-07-15 MED ORDER — TRAMADOL HCL 50 MG PO TABS: 50.0000 mg | ORAL_TABLET | Freq: Four times a day (QID) | ORAL | Status: DC | PRN

## 2015-07-15 MED ORDER — PENICILLIN V POTASSIUM 250 MG PO TABS: 500.0000 mg | ORAL_TABLET | Freq: Four times a day (QID) | ORAL | Status: AC

## 2015-07-15 NOTE — ED Notes (Signed)
Declined W/C at D/C and was escorted to lobby by RN. 

## 2015-07-15 NOTE — ED Notes (Signed)
Was seen here in Nov for same problem, right lower molar. Dentist recommended extraction but pt unable to afford recommended procedure.

## 2015-07-15 NOTE — Discharge Instructions (Signed)
Dental Pain Dental pain may be caused by many things, including:  Tooth decay (cavities or caries). Cavities expose the nerve of your tooth to air and hot or cold temperatures. This can cause pain or discomfort.  Abscess or infection. A dental abscess is a collection of infected pus from a bacterial infection in the inner part of the tooth (pulp). It usually occurs at the end of the tooth's root.  Injury.  An unknown reason (idiopathic). Your pain may be mild or severe. It may only occur when:  You are chewing.  You are exposed to hot or cold temperature.  You are eating or drinking sugary foods or beverages, such as soda or candy. Your pain may also be constant. HOME CARE INSTRUCTIONS Watch your dental pain for any changes. The following actions may help to lessen any discomfort that you are feeling:  Take medicines only as directed by your dentist.  If you were prescribed an antibiotic medicine, finish all of it even if you start to feel better.  Keep all follow-up visits as directed by your dentist. This is important.  Do not apply heat to the outside of your face.  Rinse your mouth or gargle with salt water if directed by your dentist. This helps with pain and swelling.  You can make salt water by adding  tsp of salt to 1 cup of warm water.  Apply ice to the painful area of your face:  Put ice in a plastic bag.  Place a towel between your skin and the bag.  Leave the ice on for 20 minutes, 2-3 times per day.  Avoid foods or drinks that cause you pain, such as:  Very hot or very cold foods or drinks.  Sweet or sugary foods or drinks. SEEK MEDICAL CARE IF:  Your pain is not controlled with medicines.  Your symptoms are worse.  You have new symptoms. SEEK IMMEDIATE MEDICAL CARE IF:  You are unable to open your mouth.  You are having trouble breathing or swallowing.  You have a fever.  Your face, neck, or jaw is swollen.   This information is not  intended to replace advice given to you by your health care provider. Make sure you discuss any questions you have with your health care provider.   Document Released: 05/05/2005 Document Revised: 09/19/2014 Document Reviewed: 05/01/2014 Elsevier Interactive Patient Education 2016 [employer]. [employer] Assistance The United Ways 211 is a great source of information about community services available.  Access by dialing 2-1-1 from anywhere in [place], or by website -  PooledIncome.pl.   Other Local Resources (Updated 05/2015)  Financial Assistance   Services    Phone Number and Address  [facility]  Low-cost medical care - 1st and 3rd Saturday of every month  Must not qualify for public or private insurance and must have limited income [phone] [address] [place], [place]    [place] [employer] of Social Services  Child care  Emergency assistance for housing and [employer]  Medicaid [phone] [address] [place], [place] [address]   [facility]  Low-cost medical care for children, communicable diseases, sexually-transmitted diseases, immunizations, maternity care, womens health and family planning [phone] [address] [place], [place] [address]  [facility]   Medication assistance for [facility] residents  Must meet income requirements [phone] [address] [place], [place].    [facility]  Child care  Emergency assistance for housing and [employer]  Medicaid [phone] [address] [place], [place] [address]  [facility] and [facility]   Low-cost medical care,   Monday through Friday, 9 am to 6 pm.   Accepts Medicare/Medicaid, and self-pay [phone] [address] [place], [place] [address]  [facility]   Low-cost medical care - Monday through Friday, 8:30 am - 5:30 pm  Accepts Medicaid and self-pay [phone] [address] [place], [place] [address]   [facility]  Primary medical care, including for those with sickle cell disease  Accepts Medicare, Medicaid, insurance and self-pay [phone] [address] [place], [place]  [facility]   Primary medical care  Accepts Medicare, [place], insurance and self-pay [phone] [address] [place], [place] [address]   [facility] Department of Social Services  Child care  Emergency assistance for housing and [employer]  Medicaid [phone] [address] [place], [place] [address]  [facility] Department of Health and [employer]  Child care  Emergency assistance for housing and [employer]  Medicaid [id number] [address] [place], [place] [address]   [facility] Medication Assistance Program  Medication assistance for [facility] residents with no insurance only  Must have a primary care doctor [phone] [address] [place], [place]  [facility]   Primary medical care  [place], [place], insurance  [phone] [address] [place], [place]  [facility]   Medication assistance [phone]  [facility]   Primary medical care  Accepts Medicare, [place], insurance and self-pay [phone] [address] [place], [place] [address]  [facility]   Primary medical care  Accepts Medicare, [place], insurance and self-pay [phone] [address] [place], [place] [address]  [facility]  For [place] residents between the ages of 76 and 3 who do not have any form of health insurance, Medicare, [place], or [place] benefits.  Services are provided free of charge to uninsured patients who  fall within federal poverty guidelines.    Hours: Tuesdays and Thursdays, 4:15 - 8 pm [phone] [address] [place], [place] [address]  [facility]     Primary medical care  Dental care  Nutritional counseling  Pharmacy  Accepts Medicaid, Medicare, most insurance.  Fees are adjusted based on ability to pay.   [phone] [facility] [address] [place], [place]  [phone] [facility] [address] [place], [place]  [phone] [facility] [place], [place]  [phone] [facility], [address] [place], [place]  [phone] [facility] [address] [place], [place]  [facility]  Womens health and family planning [phone] [address] [place], [place]  [facility] Department of Social Services  Child care  Emergency assistance for housing and [employer]  Medicaid [phone] [address], [place], [place] [address]   [facility]    Ages 8 and older  Hours: Mondays and Thursdays, 7:00 am - 9:00 am Patients are seen on a first come, first served basis. [phone], ext. [phone] [place], [place]  [facility] Division of Social Services  Child care  Emergency assistance for housing and [employer]  Medicaid [phone] [place], [place] [address]  [facility]  Medication assistance  Rental assistance  Food pantry  Medication assistance  Housing assistance  Emergency food distribution  Utility assistance [phone] [address] [place], [place]  [phone]  [address] [place], [place] [address] Hours: Tuesdays and Thursdays from 9am - 12 noon by appointment only  [phone] [address] [place], [place] [address]  [facility] Lanae Boast   Accepts private insurance, [place], and [place].  Payment is based on a sliding scale for those without insurance.  Hours: Mondays, Tuesdays and Thursdays, 8:30 am - 5:30 pm.   [phone] [address] [place], [place]  [facility] - Family Medicine at [facility], [place], and [place].  Payment is based on a sliding scale for those without insurance. [phone] [address] [place], [place]  [facility] - Pediatrics at E. Scientist, research (physical sciences), [employer], and [place].  Payment is based on a sliding scale for those without insurance [phone] [address], [employer], [place]  [facility] - Pediatrics at [employer], [place], and [place].  Payment is based on a sliding scale for those without insurance. [phone] [address] [place], [place]  [facility] - Pediatrics at [facility], [place], and [place].  Payment is based on a sliding scale for those without insurance. [phone], [phone] [place], [place].    [facility]  Maternity care.  Accepts Medicaid and self-pay. [phone] [address] [place], [place]  [employer] [facility] The United Ways 211 is a great source of information about community services available.  Access by dialing 2-1-1 from anywhere in [place], or by website -  PooledIncome.pl.   Other Local Resources (Updated 05/2015)  Dental  Care   Services    Phone Number and Address  Cost  [facility] For children 54 - 38 years of age:   Cleaning  Tooth brushing/flossing instruction  Sealants, fillings, crowns  Extractions  Emergency treatment  [phone] [address] [place], [place] [address]  Charges based on family income.  Medicaid and some insurance plans accepted.     [facility], fillings, crowns  Extractions  Emergency treatment [phone] [address] [place], [place]  Pregnant women 37 years of age or older with a Medicaid card  [facility] [place]  Cleaning  Sealants, fillings, crowns  Extractions  Emergency treatment [phone] [address] [place], [place] Pregnant women 38 years of age or older with a Medicaid card  [facility] Department of Health - [facility] For children 68 - 15 years of age:   Cleaning  Tooth brushing/flossing instruction  Sealants, fillings, crowns  Extractions  Emergency treatment Limited orthodontic services for patients with Medicaid [phone] [address] [place], [place] [address] Medicaid and [facility] Health Choice cover for children up to age 15 and pregnant women.  Parents of children up to age 25 without Medicaid pay a reduced fee at time of service.  [facility] Department of [employer] For children 14 - 21 years of age:   Cleaning  Tooth brushing/flossing instruction  Sealants, fillings, crowns  Extractions  Emergency treatment Limited orthodontic services  for patients with Medicaid [phone] [address] [place], [place].  Medicaid and [place] Health Choice cover for children up to age 64 and pregnant women.  Parents of children up to age 33 without Medicaid pay a reduced fee.  [facility]  Sealants, fillings, crowns  Extractions  Hours: Tuesdays and Thursdays, 4:15 - 8 pm [phone] [address] [place], [place] [address] Services free of charge to [facility] residents ages 18-64 who do not have health insurance, Medicare, [place], or [place] benefits and fall within federal poverty guidelines   [employer]    Provides dental care in addition to primary medical care, nutritional counseling, and pharmacy:  Nurse, mental health, fillings, crowns  Extractions                  [phone] [facility], [address] [place], [place]  [phone] [facility], [address] [place] [place], [place]  [phone] [facility] [place], [place]  [phone] [facility], [address] [place], [place]  [phone] [facility] [address] [place], [place] Accepts [place], [place], most insurance.  Also provides services available to all with fees adjusted based on ability to pay.    [facility] Division of Health Dental Clinic  Cleaning  Tooth brushing/flossing instruction  Sealants, fillings, crowns  Extractions  Emergency treatment Hours: Tuesdays, Thursdays, and Fridays from 8 am to 5 pm by appointment only. [phone] [address] [place], [place] [address] [facility] residents with Medicaid (depending on eligibility) and children with [facility] Health Choice - call for more information.  [facility]  Extractions only  Hours: 2nd and 4th Thursday of each month from 6:30 am - 9 am.   [phone] ext. [phone] [place], [place] [address] Ages 36 and older only.  Patients are seen on a first come, first served basis.  [employer]  [employer]  Extractions  Orthodontics  Endodontics  Implants/Crowns/Bridges  Complete and partial dentures [phone] [place], [place] Patients must complete an application for services.  There is often a waiting list.

## 2015-07-15 NOTE — ED Notes (Signed)
Pt from home with c/o dental pain worsening 3-4 days ago.  Pt reports she has been seen in the past for the same and saw a dentist but is not able to afford all the work the dentist has told her needs to be done.  NAD, A&O.

## 2015-07-15 NOTE — ED Provider Notes (Signed)
CSN: [id number]     Arrival date & time 07/15/15  1155 History  By signing my name below, I, Octavia Heir, attest that this documentation has been prepared under the direction and in the presence of Cheri Fowler, PA-C. Electronically Signed: Octavia Heir, ED Scribe. 07/15/2015. 1:05 PM.    No chief complaint on file.     The history is provided by the patient. No language interpreter was used.   HPI Comments: Erika Reese is a 35 y.o. female who presents to the Emergency Department complaining of intermittent, gradual worsening, moderate, right lower molar pain onset 4 days ago with associated fever of 102 that she had this morning. Pt reports having increased pain in the back of her mouth due to possibly grinding her teeth at night. She has been seen by a dentist for the same problem and was recommended for her to have a tooth extraction but she was unable to due to insurance conflict. Pt has been taking 800 mg of ibuprofen and half of a hydrocodone to alleviate her pain with no relief. She denies tongue swelling and choking.   History reviewed. No pertinent past medical history. History reviewed. No pertinent past surgical history. History reviewed. No pertinent family history. Social History  Substance Use Topics  . Smoking status: Current Every Day Smoker -- 0.25 packs/day  . Smokeless tobacco: None  . Alcohol Use: Yes   OB History    No data available     Review of Systems  Constitutional: Positive for fever.  HENT: Positive for dental problem. Negative for trouble swallowing.   All other systems reviewed and are negative.     Allergies  Review of patient's allergies indicates no known allergies.  Home Medications   Prior to Admission medications   Medication Sig Start Date End Date Taking? Authorizing Provider  amoxicillin (AMOXIL) 500 MG capsule Take 1 capsule (500 mg total) by mouth 3 (three) times daily. 04/09/15   Nicole Pisciotta, PA-C  benzonatate (TESSALON)  100 MG capsule Take 1 capsule (100 mg total) by mouth every 8 (eight) hours. 03/24/15   Hannah Muthersbaugh, PA-C  ibuprofen (ADVIL,MOTRIN) 800 MG tablet Take 1 tablet (800 mg total) by mouth 3 (three) times daily. 03/24/15   Hannah Muthersbaugh, PA-C  meloxicam (MOBIC) 15 MG tablet Take 1 tablet (15 mg total) by mouth daily. 01/05/15   Elson Areas, PA-C  methocarbamol (ROBAXIN) 500 MG tablet Take 1 tablet (500 mg total) by mouth 2 (two) times daily. 03/24/15   Hannah Muthersbaugh, PA-C  traMADol (ULTRAM) 50 MG tablet Take 1 tablet (50 mg total) by mouth every 6 (six) hours as needed. 04/09/15   Nicole Pisciotta, PA-C   Triage vitals: BP 123/76 mmHg  Pulse 87  Temp(Src) 98 F (36.7 C) (Oral)  Resp 20  SpO2 98%  LMP 07/01/2015 (Approximate) Physical Exam  Constitutional: She is oriented to person, place, and time. She appears well-developed and well-nourished.  HENT:  Head: Normocephalic and atraumatic.  Mouth/Throat: Uvula is midline, oropharynx is clear and moist and mucous membranes are normal. No trismus in the jaw. Abnormal dentition. Dental caries present.  Decayed right lower premolar.  No root or dentin exposure.  No obvious dental abscess. No tongue swelling or facial swelling.  Airway patent. Patient tolerating secretions without difficulty.   Eyes: Conjunctivae are normal.  Neck: Normal range of motion. Neck supple.  No evidence of Ludwigs angina.  Cardiovascular: Normal rate, regular rhythm and normal heart sounds.   Pulmonary/Chest: Effort normal  and breath sounds normal.  Abdominal: Bowel sounds are normal. She exhibits no distension.  Musculoskeletal:  Moves all extremities spontaneously.  Lymphadenopathy:    She has no cervical adenopathy.  Neurological: She is alert and oriented to person, place, and time.  Speech clear without dysarthria.   Skin: Skin is warm and dry.  Nursing note and vitals reviewed.   ED Course  Procedures  DIAGNOSTIC STUDIES: Oxygen  Saturation is 98% on RA, normal by my interpretation.  COORDINATION OF CARE:  1:01 PM Discussed treatment plan which includes tramadol, penicillin, and follow up with dentist with pt at bedside and pt agreed to plan.  Labs Review Labs Reviewed - No data to display  Imaging Review No results found. I have personally reviewed and evaluated these images and lab results as part of my medical decision-making.   EKG Interpretation None      MDM  Patient with dentalgia.  No abscess requiring immediate incision and drainage.  Exam not concerning for Ludwig's angina or pharyngeal abscess.  Will treat with tramadol and penicillin. Pt instructed to follow-up with dentist.  Discussed return precautions. Pt safe for discharge.  Final diagnoses:  Pain due to dental caries    I personally performed the services described in this documentation, which was scribed in my presence. The recorded information has been reviewed and is accurate.   Cheri Fowler, PA-C 07/15/15 1310  Blane Ohara, MD 07/15/15 1540

## 2015-11-27 ENCOUNTER — Emergency Department (HOSPITAL_COMMUNITY)
Admission: EM | Admit: 2015-11-27 | Discharge: 2015-11-27 | Disposition: A | Discharge status: Home / Self Care | Payer: Self-pay | Attending: Dermatology | Admitting: Dermatology

## 2015-11-27 ENCOUNTER — Encounter (HOSPITAL_COMMUNITY): Payer: Self-pay | Admitting: *Deleted

## 2015-11-27 DIAGNOSIS — R05 Cough: Secondary | ICD-10-CM | POA: Insufficient documentation

## 2015-11-27 DIAGNOSIS — Z5321 Procedure and treatment not carried out due to patient leaving prior to being seen by health care provider: Secondary | ICD-10-CM | POA: Insufficient documentation

## 2015-11-27 NOTE — ED Notes (Signed)
Patient handed labels to tech and said she was leaving.

## 2015-11-27 NOTE — ED Notes (Signed)
Pt comes in with c/o cough for the last month. Pt has not had any nasal congestion or fevers.  No medications PTA.

## 2016-01-08 ENCOUNTER — Emergency Department (HOSPITAL_COMMUNITY): Payer: Self-pay

## 2016-01-08 ENCOUNTER — Encounter (HOSPITAL_COMMUNITY): Payer: Self-pay | Admitting: Emergency Medicine

## 2016-01-08 ENCOUNTER — Emergency Department (HOSPITAL_COMMUNITY)
Admission: EM | Admit: 2016-01-08 | Discharge: 2016-01-08 | Disposition: A | Discharge status: Home / Self Care | Payer: Self-pay | Attending: Emergency Medicine | Admitting: Emergency Medicine

## 2016-01-08 DIAGNOSIS — F17200 Nicotine dependence, unspecified, uncomplicated: Secondary | ICD-10-CM | POA: Insufficient documentation

## 2016-01-08 DIAGNOSIS — Z79899 Other long term (current) drug therapy: Secondary | ICD-10-CM | POA: Insufficient documentation

## 2016-01-08 DIAGNOSIS — J40 Bronchitis, not specified as acute or chronic: Secondary | ICD-10-CM | POA: Insufficient documentation

## 2016-01-08 MED ORDER — ALBUTEROL SULFATE HFA 108 (90 BASE) MCG/ACT IN AERS
2.0000 | INHALATION_SPRAY | Freq: Once | RESPIRATORY_TRACT | Status: AC
  Administered 2016-01-08: 2 via RESPIRATORY_TRACT
  Filled 2016-01-08: qty 6.7

## 2016-01-08 MED ORDER — PREDNISONE 20 MG PO TABS: 40.0000 mg | ORAL_TABLET | Freq: Every day | ORAL | 0 refills | Status: DC

## 2016-01-08 MED ORDER — BENZONATATE 100 MG PO CAPS: 100.0000 mg | ORAL_CAPSULE | Freq: Three times a day (TID) | ORAL | 0 refills | Status: DC

## 2016-01-08 MED ORDER — BENZONATATE 100 MG PO CAPS
100.0000 mg | ORAL_CAPSULE | Freq: Once | ORAL | Status: AC
  Administered 2016-01-08: 100 mg via ORAL
  Filled 2016-01-08: qty 1

## 2016-01-08 NOTE — ED Provider Notes (Signed)
[facility]-EMERGENCY DEPT Provider Note   CSN: [id number] Arrival date & time: 01/08/16  1125     History   Chief Complaint Chief Complaint  Patient presents with  . Cough    HPI Erika BoomBrittany Winstead is a 35 y.o. female.  HPI Erika Reese is a 35 y.o. female presents to emergency department complaining of cough. Patient states she has had cough for over a week. She states cough is at times productive, at times feels like she cannot cough up the mucus. Reports associated pain in her chest. Denies any shortness of breath. Reports mild postnasal drainage, no sore throat, no earache. No neck pain or stiffness. No known fever or chills. Reports posttussive emesis. States she was sent here by her boss at work, instructing not to return until cleared. Patient works in [employer]. Patient states that her husband has similar symptoms.  History reviewed. No pertinent past medical history.  There are no active problems to display for this patient.   History reviewed. No pertinent surgical history.  OB History    No data available       Home Medications    Prior to Admission medications   Medication Sig Start Date End Date Taking? Authorizing Provider  amoxicillin (AMOXIL) 500 MG capsule Take 1 capsule (500 mg total) by mouth 3 (three) times daily. 04/09/15   Nicole Pisciotta, PA-C  benzonatate (TESSALON) 100 MG capsule Take 1 capsule (100 mg total) by mouth every 8 (eight) hours. 03/24/15   Hannah Muthersbaugh, PA-C  ibuprofen (ADVIL,MOTRIN) 800 MG tablet Take 1 tablet (800 mg total) by mouth 3 (three) times daily. 03/24/15   Hannah Muthersbaugh, PA-C  meloxicam (MOBIC) 15 MG tablet Take 1 tablet (15 mg total) by mouth daily. 01/05/15   Elson AreasLeslie K Sofia, PA-C  methocarbamol (ROBAXIN) 500 MG tablet Take 1 tablet (500 mg total) by mouth 2 (two) times daily. 03/24/15   Hannah Muthersbaugh, PA-C  traMADol (ULTRAM) 50 MG tablet Take 1 tablet (50 mg total) by mouth every 6 (six) hours as needed.  07/15/15   Cheri FowlerKayla Rose, PA-C    Family History History reviewed. No pertinent family history.  Social History Social History  Substance Use Topics  . Smoking status: Current Every Day Smoker    Packs/day: 0.25  . Smokeless tobacco: Not on file  . Alcohol use Yes     Allergies   Review of patient's allergies indicates no known allergies.   Review of Systems Review of Systems  Constitutional: Negative for chills and fever.  HENT: Positive for congestion and postnasal drip. Negative for ear pain.   Respiratory: Positive for cough. Negative for chest tightness and shortness of breath.   Cardiovascular: Positive for chest pain. Negative for palpitations and leg swelling.  Gastrointestinal: Positive for vomiting. Negative for abdominal pain, diarrhea and nausea.  Genitourinary: Negative for dysuria, flank pain, pelvic pain and vaginal discharge.  Musculoskeletal: Negative for arthralgias, myalgias, neck pain and neck stiffness.  Skin: Negative for rash.  Neurological: Negative for dizziness, weakness and headaches.  All other systems reviewed and are negative.    Physical Exam Updated Vital Signs BP 137/89   Pulse 86   Temp 97.6 F (36.4 C) (Oral)   Resp 18   Ht 5\' 4"  (1.626 m)   Wt 52.2 kg   SpO2 100%   BMI 19.74 kg/m   Physical Exam  Constitutional: She appears well-developed and well-nourished. No distress.  HENT:  Head: Normocephalic and atraumatic.  Right Ear: External ear normal.  Left Ear: External ear normal.  Nose: Nose normal.  Mouth/Throat: Oropharynx is clear and moist.  Eyes: Conjunctivae are normal.  Neck: Neck supple.  Cardiovascular: Normal rate and regular rhythm.   No murmur heard. Pulmonary/Chest: Effort normal and breath sounds normal. No respiratory distress.  Abdominal: Soft. There is no tenderness.  Musculoskeletal: She exhibits no edema.  Neurological: She is alert.  Skin: Skin is warm and dry.  Psychiatric: She has a normal mood and  affect.  Nursing note and vitals reviewed.    ED Treatments / Results  Labs (all labs ordered are listed, but only abnormal results are displayed) Labs Reviewed - No data to display  EKG  EKG Interpretation None       Radiology Dg Chest 2 View  Result Date: 01/08/2016 CLINICAL DATA:  Cough 2 months EXAM: CHEST  2 VIEW COMPARISON:  03/24/2015 FINDINGS: The heart size and mediastinal contours are within normal limits. Both lungs are clear. The visualized skeletal structures are unremarkable. IMPRESSION: No active cardiopulmonary disease. Electronically Signed   By: Marlan Palauharles  Clark M.D.   On: 01/08/2016 13:33    Procedures Procedures (including critical care time)  Medications Ordered in ED Medications  benzonatate (TESSALON) capsule 100 mg (not administered)     Initial Impression / Assessment and Plan / ED Course  I have reviewed the triage vital signs and the nursing notes.  Pertinent labs & imaging results that were available during my care of the patient were reviewed by me and considered in my medical decision making (see chart for details).  Clinical Course   Patient with persistent cough for over a week. Patient is coughing nonstop emergency department. Sounds like almost spastic type cough that she is unable to stop. Tessalon ordered for cough. Given albuterol inhaler. Chest x-ray was obtained and is negative. Will treat with steroids, 40 mg of prednisone 5 days, also will prescribe Tessalon, inhaler provided. However follow up with primary care doctor. Vital signs are normal. Afebrile. She is not hypoxic. She is in no acute distress, cough improved during her stay in emergency department.   Final Clinical Impressions(s) / ED Diagnoses   Final diagnoses:  Bronchitis    New Prescriptions Discharge Medication List as of 01/08/2016  1:42 PM    START taking these medications   Details  predniSONE (DELTASONE) 20 MG tablet Take 2 tablets (40 mg total) by mouth  daily., Starting Tue 01/08/2016, Print         Jaynie Crumbleatyana Lorianne Malbrough, PA-C 01/08/16 1532    Marily MemosJason Mesner, MD 01/09/16 1500

## 2016-01-08 NOTE — ED Triage Notes (Signed)
Pt sts cough and URI sx x 1 week with cough and some post tussive vomiting but no vomiting otherwise

## 2016-01-08 NOTE — ED Notes (Signed)
Pt has had persistent cough x 2 months. Her employer has sent her home from work "everyday this week". Pt states "coughs till she vomits". Pt is a smoker.

## 2016-01-08 NOTE — Discharge Instructions (Signed)
Inhaler 2 puffs every 4 hrs. Prednisone until all gone. Tessalon for cough as needed. Stop smoking. Follow up with your doctor.

## 2016-03-10 ENCOUNTER — Emergency Department (HOSPITAL_COMMUNITY)
Admission: EM | Admit: 2016-03-10 | Discharge: 2016-03-10 | Disposition: A | Discharge status: Home / Self Care | Payer: Self-pay | Attending: Emergency Medicine | Admitting: Emergency Medicine

## 2016-03-10 ENCOUNTER — Encounter (HOSPITAL_COMMUNITY): Payer: Self-pay | Admitting: Emergency Medicine

## 2016-03-10 DIAGNOSIS — F17200 Nicotine dependence, unspecified, uncomplicated: Secondary | ICD-10-CM | POA: Insufficient documentation

## 2016-03-10 DIAGNOSIS — K0889 Other specified disorders of teeth and supporting structures: Secondary | ICD-10-CM | POA: Insufficient documentation

## 2016-03-10 MED ORDER — ACETAMINOPHEN 325 MG PO TABS: 650.0000 mg | ORAL_TABLET | Freq: Once | ORAL | Status: DC

## 2016-03-10 MED ORDER — OXYCODONE-ACETAMINOPHEN 5-325 MG PO TABS
1.0000 | ORAL_TABLET | Freq: Once | ORAL | Status: AC
  Administered 2016-03-10: 1 via ORAL
  Filled 2016-03-10: qty 1

## 2016-03-10 MED ORDER — PENICILLIN V POTASSIUM 500 MG PO TABS: 500.0000 mg | ORAL_TABLET | Freq: Four times a day (QID) | ORAL | 0 refills | Status: DC

## 2016-03-10 MED ORDER — IBUPROFEN 400 MG PO TABS
800.0000 mg | ORAL_TABLET | Freq: Once | ORAL | Status: DC
  Filled 2016-03-10: qty 2

## 2016-03-10 MED ORDER — NAPROXEN 500 MG PO TABS: 500.0000 mg | ORAL_TABLET | Freq: Two times a day (BID) | ORAL | 0 refills | Status: DC

## 2016-03-10 NOTE — ED Triage Notes (Signed)
Pt reports right sided upper tooth pain for the last 3 days. Pt states she has been taking a family members antibiotics and using motrin with no relief.

## 2016-03-10 NOTE — ED Notes (Signed)
Patient and visitor outside of room yelling at each other and asking for pain medication.  Patient states she has taken 2000 mg of ibuprofen since last night.  Encouraged patient to remain in room and wait for PA to come and see her. Patient appears angry and wants to leave.  Visitor encouraged patient to remain and be seen.  PA into room to talk to patient

## 2016-03-10 NOTE — ED Provider Notes (Signed)
[facility] Provider Note   CSN: [id number] Arrival date & time: 03/10/16  1251     History   Chief Complaint Chief Complaint  Patient presents with  . Dental Pain    HPI Erika Reese is a 35 y.o. female.  Erika Reese is a 35 y.o. Female who presents to the emergency department complaining of right lower dental pain for the past 4 days. Patient reports she has been taking some amoxicillin from her mother intermittently and ibuprofen intermittently with little relief of her pain. She reports there is a missing tooth to her right lower jaw that is been present for a long time. She denies any discharge or mouth. No sore throat or trouble swallowing. Patient denies fevers, neck pain, neck stiffness, mouth discharge, trouble opening her mouth, rashes, ear discharge, nausea or vomiting.   The history is provided by the patient. No language interpreter was used.  Dental Pain      History reviewed. No pertinent past medical history.  There are no active problems to display for this patient.   History reviewed. No pertinent surgical history.  OB History    No data available       Home Medications    Prior to Admission medications   Medication Sig Start Date End Date Taking? Authorizing Provider  amoxicillin (AMOXIL) 500 MG capsule Take 1 capsule (500 mg total) by mouth 3 (three) times daily. 04/09/15   Nicole Pisciotta, PA-C  benzonatate (TESSALON) 100 MG capsule Take 1 capsule (100 mg total) by mouth every 8 (eight) hours. 01/08/16   Tatyana Kirichenko, PA-C  ibuprofen (ADVIL,MOTRIN) 800 MG tablet Take 1 tablet (800 mg total) by mouth 3 (three) times daily. 03/24/15   Hannah Muthersbaugh, PA-C  meloxicam (MOBIC) 15 MG tablet Take 1 tablet (15 mg total) by mouth daily. 01/05/15   Elson Areas, PA-C  methocarbamol (ROBAXIN) 500 MG tablet Take 1 tablet (500 mg total) by mouth 2 (two) times daily. 03/24/15   Hannah Muthersbaugh, PA-C  naproxen (NAPROSYN) 500 MG  tablet Take 1 tablet (500 mg total) by mouth 2 (two) times daily with a meal. 03/10/16   Everlene Farrier, PA-C  penicillin v potassium (VEETID) 500 MG tablet Take 1 tablet (500 mg total) by mouth 4 (four) times daily. 03/10/16   Everlene Farrier, PA-C  predniSONE (DELTASONE) 20 MG tablet Take 2 tablets (40 mg total) by mouth daily. 01/08/16   Tatyana Kirichenko, PA-C  traMADol (ULTRAM) 50 MG tablet Take 1 tablet (50 mg total) by mouth every 6 (six) hours as needed. 07/15/15   Cheri Fowler, PA-C    Family History No family history on file.  Social History Social History  Substance Use Topics  . Smoking status: Current Every Day Smoker    Packs/day: 0.25  . Smokeless tobacco: Not on file  . Alcohol use Yes     Allergies   Review of patient's allergies indicates no known allergies.   Review of Systems Review of Systems  Constitutional: Negative for chills and fever.  HENT: Positive for dental problem and ear pain. Negative for drooling, facial swelling, sore throat and trouble swallowing.   Eyes: Negative for pain and visual disturbance.  Musculoskeletal: Negative for neck pain and neck stiffness.  Skin: Negative for rash.  Neurological: Negative for headaches.     Physical Exam Updated Vital Signs BP 116/75 (BP Location: Left Arm)   Pulse 75   Temp 98.3 F (36.8 C) (Oral)   Resp 18   Ht 5'  5" (1.651 m)   Wt 52.2 kg   SpO2 100%   BMI 19.14 kg/m   Physical Exam  Constitutional: She appears well-developed and well-nourished. No distress.  Non-toxic appearing.   HENT:  Head: Normocephalic and atraumatic.  Right Ear: External ear normal.  Left Ear: External ear normal.  Mouth/Throat: Oropharynx is clear and moist. No oropharyngeal exudate.  Tenderness to right lower molar. Generally poor dentition. No abscess. No trismus. No discharge from the mouth. No facial swelling.  Uvula is midline without edema. Soft palate rises symmetrically. No tonsillar hypertrophy or exudates.  Tongue protrusion is normal.  Bilateral tympanic membranes are pearly-gray without erythema or loss of landmarks.   Eyes: Conjunctivae are normal. Pupils are equal, round, and reactive to light. Right eye exhibits no discharge. Left eye exhibits no discharge.  Neck: Normal range of motion. Neck supple. No JVD present. No tracheal deviation present.  Cardiovascular: Normal rate and intact distal pulses.   Pulmonary/Chest: Effort normal. No respiratory distress.  Lymphadenopathy:    She has no cervical adenopathy.  Neurological: She is alert. No cranial nerve deficit. Coordination normal.  Skin: Skin is warm and dry. No rash noted. She is not diaphoretic. No erythema. No pallor.  Psychiatric: She has a normal mood and affect. Her behavior is normal.  Nursing note and vitals reviewed.    ED Treatments / Results  Labs (all labs ordered are listed, but only abnormal results are displayed) Labs Reviewed - No data to display  EKG  EKG Interpretation None       Radiology No results found.  Procedures Procedures (including critical care time)  Medications Ordered in ED Medications  oxyCODONE-acetaminophen (PERCOCET/ROXICET) 5-325 MG per tablet 1 tablet (not administered)     Initial Impression / Assessment and Plan / ED Course  I have reviewed the triage vital signs and the nursing notes.  Pertinent labs & imaging results that were available during my care of the patient were reviewed by me and considered in my medical decision making (see chart for details).  Clinical Course   This is a 35 y.o. Female who presents to the emergency department complaining of right lower dental pain for the past 4 days. Patient reports she has been taking some amoxicillin from her mother intermittently and ibuprofen intermittently with little relief of her pain. Patient with toothache.  No gross abscess.  Exam unconcerning for Ludwig's angina or spread of infection.  Will treat with penicillin and  pain medicine.  Urged patient to follow-up with dentist.  I advised the patient to follow-up with their primary care provider this week. I advised the patient to return to the emergency department with new or worsening symptoms or new concerns. The patient verbalized understanding and agreement with plan.    Final Clinical Impressions(s) / ED Diagnoses   Final diagnoses:  Pain, dental    New Prescriptions New Prescriptions   NAPROXEN (NAPROSYN) 500 MG TABLET    Take 1 tablet (500 mg total) by mouth 2 (two) times daily with a meal.   PENICILLIN V POTASSIUM (VEETID) 500 MG TABLET    Take 1 tablet (500 mg total) by mouth 4 (four) times daily.     Everlene FarrierWilliam Raymont Andreoni, PA-C 03/10/16 1450    Vanetta MuldersScott Zackowski, MD 03/11/16 1539

## 2016-07-06 ENCOUNTER — Emergency Department (HOSPITAL_COMMUNITY)
Admission: EM | Admit: 2016-07-06 | Discharge: 2016-07-06 | Disposition: A | Discharge status: Home / Self Care | Payer: Self-pay | Attending: Emergency Medicine | Admitting: Emergency Medicine

## 2016-07-06 ENCOUNTER — Encounter (HOSPITAL_COMMUNITY): Payer: Self-pay | Admitting: *Deleted

## 2016-07-06 DIAGNOSIS — F17200 Nicotine dependence, unspecified, uncomplicated: Secondary | ICD-10-CM | POA: Insufficient documentation

## 2016-07-06 DIAGNOSIS — R42 Dizziness and giddiness: Secondary | ICD-10-CM | POA: Insufficient documentation

## 2016-07-06 DIAGNOSIS — Z79899 Other long term (current) drug therapy: Secondary | ICD-10-CM | POA: Insufficient documentation

## 2016-07-06 LAB — BASIC METABOLIC PANEL
Anion gap: 10 (ref 5–15)
BUN: 9 mg/dL (ref 6–20)
CHLORIDE: 104 mmol/L (ref 101–111)
CO2: 24 mmol/L (ref 22–32)
CREATININE: 0.82 mg/dL (ref 0.44–1.00)
Calcium: 9.2 mg/dL (ref 8.9–10.3)
Glucose, Bld: 127 mg/dL — ABNORMAL HIGH (ref 65–99)
POTASSIUM: 4.2 mmol/L (ref 3.5–5.1)
Sodium: 138 mmol/L (ref 135–145)

## 2016-07-06 LAB — CBC
HEMATOCRIT: 36.4 % (ref 36.0–46.0)
Hemoglobin: 12.4 g/dL (ref 12.0–15.0)
MCH: 31.9 pg (ref 26.0–34.0)
MCHC: 34.1 g/dL (ref 30.0–36.0)
MCV: 93.6 fL (ref 78.0–100.0)
PLATELETS: 231 10*3/uL (ref 150–400)
RBC: 3.89 MIL/uL (ref 3.87–5.11)
RDW: 13.1 % (ref 11.5–15.5)
WBC: 8.4 10*3/uL (ref 4.0–10.5)

## 2016-07-06 LAB — I-STAT BETA HCG BLOOD, ED (MC, WL, AP ONLY): I-stat hCG, quantitative: <5 m[IU]/mL (ref <5)

## 2016-07-06 NOTE — ED Triage Notes (Signed)
Pt reports having dizziness, fatigue and headaches for over 2 weeks. Denies n/v/d. No acute distress noted at triage.

## 2016-07-06 NOTE — Discharge Instructions (Signed)
Your hemoglobin is normal with normal electrolytes. Please establish primary care and follow up for further evaluation.

## 2016-07-06 NOTE — ED Provider Notes (Signed)
[facility]-EMERGENCY DEPT Provider Note   CSN: [id number] Arrival date & time: 07/06/16  1819   By signing my name below, I, Freida Busman, attest that this documentation has been prepared under the direction and in the presence of Margarita Grizzle, MD . Electronically Signed: Freida Busman, Scribe. 07/06/2016. 7:35 PM.   History   Chief Complaint Chief Complaint  Patient presents with  . Dizziness     The history is provided by the patient. No language interpreter was used.     HPI Comments:  Erika Reese is a 36 y.o. female who presents to the Emergency Department complaining of intermittent episodes of dizziness x 2-3 weeks. She notes the episodes most often occur when she stands, states she feels near syncopal and needs to sit back down. She has also been experiencing hot flashes at night and abdominal bloating as well. Pt has a h/o anemia. Her LNMP was at the end of January. She has a h/o tubal ligation Pt is a current smoker. She denies cough and SOB.   History reviewed. No pertinent past medical history.  There are no active problems to display for this patient.   History reviewed. No pertinent surgical history.  OB History    No data available       Home Medications    Prior to Admission medications   Medication Sig Start Date End Date Taking? Authorizing Provider  amoxicillin (AMOXIL) 500 MG capsule Take 1 capsule (500 mg total) by mouth 3 (three) times daily. 04/09/15   Nicole Pisciotta, PA-C  benzonatate (TESSALON) 100 MG capsule Take 1 capsule (100 mg total) by mouth every 8 (eight) hours. 01/08/16   Tatyana Kirichenko, PA-C  ibuprofen (ADVIL,MOTRIN) 800 MG tablet Take 1 tablet (800 mg total) by mouth 3 (three) times daily. 03/24/15   Hannah Muthersbaugh, PA-C  meloxicam (MOBIC) 15 MG tablet Take 1 tablet (15 mg total) by mouth daily. 01/05/15   Elson Areas, PA-C  methocarbamol (ROBAXIN) 500 MG tablet Take 1 tablet (500 mg total) by mouth 2 (two) times daily.  03/24/15   Hannah Muthersbaugh, PA-C  naproxen (NAPROSYN) 500 MG tablet Take 1 tablet (500 mg total) by mouth 2 (two) times daily with a meal. 03/10/16   Everlene Farrier, PA-C  penicillin v potassium (VEETID) 500 MG tablet Take 1 tablet (500 mg total) by mouth 4 (four) times daily. 03/10/16   Everlene Farrier, PA-C  predniSONE (DELTASONE) 20 MG tablet Take 2 tablets (40 mg total) by mouth daily. 01/08/16   Tatyana Kirichenko, PA-C  traMADol (ULTRAM) 50 MG tablet Take 1 tablet (50 mg total) by mouth every 6 (six) hours as needed. 07/15/15   Cheri Fowler, PA-C    Family History History reviewed. No pertinent family history.  Social History Social History  Substance Use Topics  . Smoking status: Current Every Day Smoker    Packs/day: 0.25  . Smokeless tobacco: Not on file  . Alcohol use Yes     Allergies   Patient has no known allergies.   Review of Systems Review of Systems  Respiratory: Negative for cough and shortness of breath.   Gastrointestinal: Positive for abdominal distention.  Neurological: Positive for dizziness. Negative for syncope.     Physical Exam Updated Vital Signs BP 139/76 (BP Location: Left Arm)   Pulse 75   Temp 98.7 F (37.1 C) (Oral)   Resp 18   Ht 5\' 4"  (1.626 m)   Wt 120 lb (54.4 kg)   LMP 06/08/2016   SpO2  100%   BMI 20.60 kg/m   Physical Exam  Constitutional: She is oriented to person, place, and time. She appears well-developed and well-nourished. No distress.  HENT:  Head: Normocephalic and atraumatic.  Eyes: Conjunctivae and EOM are normal. Pupils are equal, round, and reactive to light.  Neck: Normal range of motion. Neck supple.  Cardiovascular: Normal rate, regular rhythm and normal heart sounds.   Pulmonary/Chest: Effort normal and breath sounds normal.  Abdominal: Soft. Bowel sounds are normal. She exhibits no distension and no mass. There is no tenderness. There is no guarding.  Musculoskeletal: Normal range of motion.  Neurological:  She is alert and oriented to person, place, and time.  Skin: Skin is warm and dry. Capillary refill takes less than 2 seconds.  Psychiatric: She has a normal mood and affect.  Nursing note and vitals reviewed.    ED Treatments / Results  DIAGNOSTIC STUDIES:  Oxygen Saturation is 100% on RA, normal by my interpretation.    COORDINATION OF CARE:  7:15 PM Discussed treatment plan with pt at bedside and pt agreed to plan.  Labs (all labs ordered are listed, but only abnormal results are displayed) Labs Reviewed  BASIC METABOLIC PANEL - Abnormal; Notable for the following:       Result Value   Glucose, Bld 127 (*)    All other components within normal limits  CBC  I-STAT BETA HCG BLOOD, ED ([facility], [facility], [facility] ONLY)    EKG  EKG Interpretation None       Radiology No results found.  Procedures Procedures (including critical care time)  Medications Ordered in ED Medications - No data to display   Initial Impression / Assessment and Plan / ED Course  I have reviewed the triage vital signs and the nursing notes.  Pertinent labs & imaging results that were available during my care of the patient were reviewed by me and considered in my medical decision making (see chart for details).       Final Clinical Impressions(s) / ED Diagnoses   Final diagnoses:  Lightheadedness    New Prescriptions New Prescriptions   No medications on file   I personally performed the services described in this documentation, which was scribed in my presence. The recorded information has been reviewed and considered.    Margarita Grizzleanielle Jorey Dollard, MD 07/06/16 [id number]

## 2016-08-21 ENCOUNTER — Emergency Department (HOSPITAL_COMMUNITY)
Admission: EM | Admit: 2016-08-21 | Discharge: 2016-08-21 | Disposition: A | Discharge status: Home / Self Care | Payer: Self-pay | Attending: Emergency Medicine | Admitting: Emergency Medicine

## 2016-08-21 ENCOUNTER — Encounter (HOSPITAL_COMMUNITY): Payer: Self-pay | Admitting: Emergency Medicine

## 2016-08-21 DIAGNOSIS — K0889 Other specified disorders of teeth and supporting structures: Secondary | ICD-10-CM

## 2016-08-21 DIAGNOSIS — F17200 Nicotine dependence, unspecified, uncomplicated: Secondary | ICD-10-CM | POA: Insufficient documentation

## 2016-08-21 DIAGNOSIS — K029 Dental caries, unspecified: Secondary | ICD-10-CM | POA: Insufficient documentation

## 2016-08-21 MED ORDER — TRAMADOL HCL 50 MG PO TABS
50.0000 mg | ORAL_TABLET | Freq: Once | ORAL | Status: AC
Start: 2016-08-21 — End: 2016-08-21
  Administered 2016-08-21: 50 mg via ORAL
  Filled 2016-08-21: qty 1

## 2016-08-21 MED ORDER — TRAMADOL HCL 50 MG PO TABS: 50.0000 mg | ORAL_TABLET | Freq: Four times a day (QID) | ORAL | 0 refills | Status: DC | PRN

## 2016-08-21 MED ORDER — PENICILLIN V POTASSIUM 500 MG PO TABS: 500.0000 mg | ORAL_TABLET | Freq: Four times a day (QID) | ORAL | 0 refills | Status: DC

## 2016-08-21 MED ORDER — PENICILLIN V POTASSIUM 250 MG PO TABS
500.0000 mg | ORAL_TABLET | Freq: Once | ORAL | Status: AC
  Administered 2016-08-21: 500 mg via ORAL
  Filled 2016-08-21: qty 2

## 2016-08-21 NOTE — ED Triage Notes (Signed)
Pt reports she has been having a toothache on the bottom right side. Pt states she has been taking motrin and Tylenol with no relief.

## 2016-08-21 NOTE — ED Notes (Signed)
Dental box outside room.

## 2016-08-21 NOTE — ED Provider Notes (Signed)
[facility] Provider Note   CSN: [id number] Arrival date & time: 08/21/16  1507     History   Chief Complaint Chief Complaint  Patient presents with  . Dental Pain    HPI Erika Reese is a 36 y.o. female.Complains of dental pain at tooth #28 for the past 4 days. She's had intermittent similar pain for 5 months. She is treated herself with Motrin and Tylenol without relief. No fever. No other associated symptoms. Nothing makes symptoms better or worse. Pain is severe at present. She's not seen a dentist in several years.   HPI  History reviewed. No pertinent past medical history.  There are no active problems to display for this patient.   History reviewed. No pertinent surgical history.  OB History    No data available       Home Medications    Prior to Admission medications   Medication Sig Start Date End Date Taking? Authorizing Provider  amoxicillin (AMOXIL) 500 MG capsule Take 1 capsule (500 mg total) by mouth 3 (three) times daily. 04/09/15   Nicole Pisciotta, PA-C  benzonatate (TESSALON) 100 MG capsule Take 1 capsule (100 mg total) by mouth every 8 (eight) hours. 01/08/16   Tatyana Kirichenko, PA-C  ibuprofen (ADVIL,MOTRIN) 800 MG tablet Take 1 tablet (800 mg total) by mouth 3 (three) times daily. 03/24/15   Hannah Muthersbaugh, PA-C  meloxicam (MOBIC) 15 MG tablet Take 1 tablet (15 mg total) by mouth daily. 01/05/15   Elson Areas, PA-C  methocarbamol (ROBAXIN) 500 MG tablet Take 1 tablet (500 mg total) by mouth 2 (two) times daily. 03/24/15   Hannah Muthersbaugh, PA-C  naproxen (NAPROSYN) 500 MG tablet Take 1 tablet (500 mg total) by mouth 2 (two) times daily with a meal. 03/10/16   Everlene Farrier, PA-C  penicillin v potassium (VEETID) 500 MG tablet Take 1 tablet (500 mg total) by mouth 4 (four) times daily. 03/10/16   Everlene Farrier, PA-C  predniSONE (DELTASONE) 20 MG tablet Take 2 tablets (40 mg total) by mouth daily. 01/08/16   Tatyana Kirichenko, PA-C    traMADol (ULTRAM) 50 MG tablet Take 1 tablet (50 mg total) by mouth every 6 (six) hours as needed. 07/15/15   Cheri Fowler, PA-C    Family History No family history on file.  Social History Social History  Substance Use Topics  . Smoking status: Current Every Day Smoker    Packs/day: 0.25  . Smokeless tobacco: Not on file  . Alcohol use Yes     Allergies   Patient has no known allergies.   Review of Systems Review of Systems  Constitutional: Negative.   HENT: Positive for dental problem.      Physical Exam Updated Vital Signs BP 118/86 (BP Location: Left Arm)   Pulse 65   Temp 98.9 F (37.2 C) (Oral)   Resp 16   SpO2 99%   Physical Exam  Constitutional: She appears well-developed and well-nourished. She appears distressed.  Tearful Glasgow Coma Score 15  HENT:  Head: Normocephalic and atraumatic.  Right Ear: External ear normal.  Tooth #28 badly decayed. No surrounding fluctuance of the gingiva. No trismus. Submandibular area not firm or tender.  Cardiovascular: Normal rate.   Pulmonary/Chest: Effort normal.  Abdominal: She exhibits no distension.  Lymphadenopathy:    She has no cervical adenopathy.  Skin: Skin is warm and dry.  Psychiatric: Thought content normal.  Nursing note and vitals reviewed.    ED Treatments / Results  Labs (all labs  ordered are listed, but only abnormal results are displayed) Labs Reviewed - No data to display  EKG  EKG Interpretation None       Radiology No results found.  Procedures Procedures (including critical care time)  Medications Ordered in ED Medications  traMADol (ULTRAM) tablet 50 mg (not administered)  penicillin v potassium (VEETID) tablet 500 mg (not administered)     Initial Impression / Assessment and Plan / ED Course  I have reviewed the triage vital signs and the nursing notes.  Pertinent labs & imaging results that were available during my care of the patient were reviewed by me and  considered in my medical decision making (see chart for details).     Plan prescription pen VK, Ultram. Given resource referral for dental problems  Final Clinical Impressions(s) / ED Diagnoses  Diagnosis dental pain Final diagnoses:  None    New Prescriptions New Prescriptions   No medications on file     Doug Sou, MD 08/21/16 1601

## 2016-08-21 NOTE — Discharge Instructions (Signed)
Call any of the numbers on the resource guide that you were given to get help with your dental problem.

## 2016-11-12 ENCOUNTER — Emergency Department (HOSPITAL_COMMUNITY)
Admission: EM | Admit: 2016-11-12 | Discharge: 2016-11-12 | Disposition: A | Discharge status: Home / Self Care | Payer: Self-pay | Attending: Emergency Medicine | Admitting: Emergency Medicine

## 2016-11-12 ENCOUNTER — Encounter (HOSPITAL_COMMUNITY): Payer: Self-pay | Admitting: Emergency Medicine

## 2016-11-12 DIAGNOSIS — Z5321 Procedure and treatment not carried out due to patient leaving prior to being seen by health care provider: Secondary | ICD-10-CM | POA: Insufficient documentation
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
## 2016-11-13 ENCOUNTER — Emergency Department (HOSPITAL_COMMUNITY)
Admission: EM | Admit: 2016-11-13 | Discharge: 2016-11-13 | Disposition: A | Discharge status: Home / Self Care | Payer: No Typology Code available for payment source | Attending: Emergency Medicine | Admitting: Emergency Medicine

## 2016-11-13 ENCOUNTER — Emergency Department (HOSPITAL_COMMUNITY)
Admission: EM | Admit: 2016-11-13 | Discharge: 2016-11-13 | Disposition: A | Discharge status: Home / Self Care | Payer: Self-pay | Attending: Emergency Medicine | Admitting: Emergency Medicine

## 2016-11-13 ENCOUNTER — Encounter (HOSPITAL_COMMUNITY): Payer: Self-pay

## 2016-11-13 ENCOUNTER — Encounter (HOSPITAL_COMMUNITY): Payer: Self-pay | Admitting: Emergency Medicine

## 2016-11-13 DIAGNOSIS — S3992XA Unspecified injury of lower back, initial encounter: Secondary | ICD-10-CM | POA: Diagnosis present

## 2016-11-13 DIAGNOSIS — F17200 Nicotine dependence, unspecified, uncomplicated: Secondary | ICD-10-CM | POA: Insufficient documentation

## 2016-11-13 DIAGNOSIS — Y939 Activity, unspecified: Secondary | ICD-10-CM | POA: Diagnosis not present

## 2016-11-13 DIAGNOSIS — Y92410 Unspecified street and highway as the place of occurrence of the external cause: Secondary | ICD-10-CM | POA: Insufficient documentation

## 2016-11-13 DIAGNOSIS — Z5321 Procedure and treatment not carried out due to patient leaving prior to being seen by health care provider: Secondary | ICD-10-CM | POA: Insufficient documentation

## 2016-11-13 DIAGNOSIS — Y999 Unspecified external cause status: Secondary | ICD-10-CM | POA: Diagnosis not present

## 2016-11-13 DIAGNOSIS — S39012A Strain of muscle, fascia and tendon of lower back, initial encounter: Secondary | ICD-10-CM

## 2016-11-13 DIAGNOSIS — M549 Dorsalgia, unspecified: Secondary | ICD-10-CM | POA: Insufficient documentation

## 2016-11-13 MED ORDER — KETOROLAC TROMETHAMINE 15 MG/ML IJ SOLN
15.0000 mg | Freq: Once | INTRAMUSCULAR | Status: AC
Start: 2016-11-13 — End: 2016-11-13
  Administered 2016-11-13: 15 mg via INTRAMUSCULAR
  Filled 2016-11-13: qty 1

## 2016-11-13 MED ORDER — DEXAMETHASONE SODIUM PHOSPHATE 10 MG/ML IJ SOLN
10.0000 mg | Freq: Once | INTRAMUSCULAR | Status: AC
  Administered 2016-11-13: 10 mg via INTRAMUSCULAR
  Filled 2016-11-13: qty 1

## 2016-11-13 MED ORDER — LORAZEPAM 1 MG PO TABS
1.0000 mg | ORAL_TABLET | Freq: Once | ORAL | Status: AC
  Administered 2016-11-13: 1 mg via ORAL
  Filled 2016-11-13: qty 1

## 2016-11-13 MED ORDER — OXYCODONE-ACETAMINOPHEN 5-325 MG PO TABS
1.0000 | ORAL_TABLET | Freq: Once | ORAL | Status: AC
  Administered 2016-11-13: 1 via ORAL
  Filled 2016-11-13: qty 1
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
## 2016-11-13 NOTE — ED Notes (Signed)
Pt stated she was leaving due to the wait time and will be going to [place].
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
## 2017-01-09 ENCOUNTER — Encounter (HOSPITAL_COMMUNITY): Payer: Self-pay | Admitting: *Deleted

## 2017-01-09 ENCOUNTER — Emergency Department (HOSPITAL_COMMUNITY)
Admission: EM | Admit: 2017-01-09 | Discharge: 2017-01-09 | Disposition: A | Discharge status: Home / Self Care | Payer: Self-pay | Attending: Emergency Medicine | Admitting: Emergency Medicine

## 2017-01-09 DIAGNOSIS — R531 Weakness: Secondary | ICD-10-CM | POA: Insufficient documentation

## 2017-01-09 DIAGNOSIS — F17210 Nicotine dependence, cigarettes, uncomplicated: Secondary | ICD-10-CM | POA: Insufficient documentation

## 2017-01-09 DIAGNOSIS — F419 Anxiety disorder, unspecified: Secondary | ICD-10-CM | POA: Insufficient documentation

## 2017-01-09 DIAGNOSIS — Z79899 Other long term (current) drug therapy: Secondary | ICD-10-CM | POA: Insufficient documentation

## 2017-01-09 LAB — I-STAT CHEM 8, ED
BUN: 9 mg/dL (ref 6–20)
CALCIUM ION: 1.14 mmol/L — AB (ref 1.15–1.40)
CHLORIDE: 108 mmol/L (ref 101–111)
CREATININE: 0.7 mg/dL (ref 0.44–1.00)
GLUCOSE: 94 mg/dL (ref 65–99)
HCT: 38 % (ref 36.0–46.0)
Hemoglobin: 12.9 g/dL (ref 12.0–15.0)
POTASSIUM: 4.1 mmol/L (ref 3.5–5.1)
Sodium: 140 mmol/L (ref 135–145)
TCO2: 22 mmol/L (ref 22–32)

## 2017-01-09 LAB — CBC
HEMATOCRIT: 36.6 % (ref 36.0–46.0)
Hemoglobin: 12.7 g/dL (ref 12.0–15.0)
MCH: 31.8 pg (ref 26.0–34.0)
MCHC: 34.7 g/dL (ref 30.0–36.0)
MCV: 91.7 fL (ref 78.0–100.0)
PLATELETS: 239 10*3/uL (ref 150–400)
RBC: 3.99 MIL/uL (ref 3.87–5.11)
RDW: 13.1 % (ref 11.5–15.5)
WBC: 11.2 10*3/uL — AB (ref 4.0–10.5)

## 2017-01-09 LAB — RAPID URINE DRUG SCREEN, HOSP PERFORMED
Amphetamines: NOT DETECTED
BARBITURATES: NOT DETECTED
BENZODIAZEPINES: NOT DETECTED
COCAINE: NOT DETECTED
OPIATES: NOT DETECTED
Tetrahydrocannabinol: POSITIVE — AB

## 2017-01-09 LAB — PREGNANCY, URINE: Preg Test, Ur: NEGATIVE

## 2017-01-09 MED ORDER — LORAZEPAM 1 MG PO TABS: 1.0000 mg | ORAL_TABLET | Freq: Two times a day (BID) | ORAL | 0 refills | Status: DC | PRN

## 2017-01-09 NOTE — Discharge Instructions (Signed)
We suspect anxiety is playing a role in this, but we are not a 100% sure. Please see the [facility] team, they have a walk in clinic early in the morning, and they will abel to initial assessment. If not improving despite the anxiety meds, consider seeing neurologist.  Return to the ER if the symptoms get worse.

## 2017-01-09 NOTE — ED Provider Notes (Signed)
[facility]-EMERGENCY DEPT Provider Note   CSN: [id number] Arrival date & time: 01/09/17  1258     History   Chief Complaint Chief Complaint  Patient presents with  . Anxiety    HPI Erika Reese is a 36 y.o. female.  HPI Pt comes in with cc of anxiety. Pt reports that she has been having anxiety all of her life. Over the last 2-3 days, she has had 3 episodes where she would start feeling hot, and then her body would cramp and stiffen up. She has noticed that her fingers with get contracted during these events and legs will lock up. Pt's symptoms got better when this occurred at work with salt. The symptoms get her more anxious and she starts having trouble breathing. Pt recalls the entire event always and there is no LOC. Pt uses marijuana. Pt denies any family hx of neuro-muscular problems. She has not had incontinence or tonic clonic jerking at any point.  History reviewed. No pertinent past medical history.  There are no active problems to display for this patient.   History reviewed. No pertinent surgical history.  OB History    No data available       Home Medications    Prior to Admission medications   Medication Sig Start Date End Date Taking? Authorizing Provider  amoxicillin (AMOXIL) 500 MG capsule Take 1 capsule (500 mg total) by mouth 3 (three) times daily. 04/09/15   Pisciotta, Joni Reining, PA-C  benzonatate (TESSALON) 100 MG capsule Take 1 capsule (100 mg total) by mouth every 8 (eight) hours. 01/08/16   Kirichenko, Lemont Fillers, PA-C  ibuprofen (ADVIL,MOTRIN) 800 MG tablet Take 1 tablet (800 mg total) by mouth 3 (three) times daily. 03/24/15   Muthersbaugh, Dahlia Client, PA-C  LORazepam (ATIVAN) 1 MG tablet Take 1 tablet (1 mg total) by mouth every 12 (twelve) hours as needed for anxiety. 01/09/17   Derwood Kaplan, MD  meloxicam (MOBIC) 15 MG tablet Take 1 tablet (15 mg total) by mouth daily. 01/05/15   Elson Areas, PA-C  methocarbamol (ROBAXIN) 500 MG tablet Take 1 tablet  (500 mg total) by mouth 2 (two) times daily. 03/24/15   Muthersbaugh, Dahlia Client, PA-C  naproxen (NAPROSYN) 500 MG tablet Take 1 tablet (500 mg total) by mouth 2 (two) times daily with a meal. 03/10/16   Everlene Farrier, PA-C  penicillin v potassium (VEETID) 500 MG tablet Take 1 tablet (500 mg total) by mouth 4 (four) times daily. 08/21/16   Doug Sou, MD  predniSONE (DELTASONE) 20 MG tablet Take 2 tablets (40 mg total) by mouth daily. 01/08/16   Kirichenko, Lemont Fillers, PA-C  traMADol (ULTRAM) 50 MG tablet Take 1 tablet (50 mg total) by mouth every 6 (six) hours as needed. 08/21/16   Doug Sou, MD    Family History No family history on file.  Social History Social History  Substance Use Topics  . Smoking status: Current Every Day Smoker    Packs/day: 0.25    Types: Cigarettes  . Smokeless tobacco: Never Used  . Alcohol use Yes     Allergies   Patient has no known allergies.   Review of Systems Review of Systems  Constitutional: Positive for activity change.  Cardiovascular: Negative for chest pain.  Allergic/Immunologic: Negative for immunocompromised state.  Neurological: Positive for weakness. Negative for dizziness and headaches.     Physical Exam Updated Vital Signs BP 109/69   Pulse 64   Temp 98.4 F (36.9 C) (Oral)   Resp 16   SpO2 100%  Physical Exam  Constitutional: She is oriented to person, place, and time. She appears well-developed.  HENT:  Head: Normocephalic and atraumatic.  Eyes: Pupils are equal, round, and reactive to light. EOM are normal.  Neck: Normal range of motion. Neck supple.  Cardiovascular: Normal rate.   Pulmonary/Chest: Effort normal.  Abdominal: Bowel sounds are normal.  Neurological: She is alert and oriented to person, place, and time. No cranial nerve deficit. Coordination normal.  Skin: Skin is warm and dry.  Nursing note and vitals reviewed.    ED Treatments / Results  Labs (all labs ordered are listed, but only abnormal  results are displayed) Labs Reviewed  RAPID URINE DRUG SCREEN, HOSP PERFORMED - Abnormal; Notable for the following:       Result Value   Tetrahydrocannabinol POSITIVE (*)    All other components within normal limits  CBC - Abnormal; Notable for the following:    WBC 11.2 (*)    All other components within normal limits  I-STAT CHEM 8, ED - Abnormal; Notable for the following:    Calcium, Ion 1.14 (*)    All other components within normal limits  PREGNANCY, URINE    EKG  EKG Interpretation None       Radiology No results found.  Procedures Procedures (including critical care time)  Medications Ordered in ED Medications - No data to display   Initial Impression / Assessment and Plan / ED Course  I have reviewed the triage vital signs and the nursing notes.  Pertinent labs & imaging results that were available during my care of the patient were reviewed by me and considered in my medical decision making (see chart for details).     Pt her normal exam. She appears to be having a psychogenic event, as the symptoms don't fit criteria for absent seizure. Pt's anxiety builds up after the event occurs, which seems to come about w/o any warning - there is a prodrome to it.  Plan is to get pt to see psych. If symptoms dont improve, she has been advised to see neuro. If the symptoms get worse, and there is seizures or syncope, she will return to the Er.   Final Clinical Impressions(s) / ED Diagnoses   Final diagnoses:  Anxiety    New Prescriptions New Prescriptions   LORAZEPAM (ATIVAN) 1 MG TABLET    Take 1 tablet (1 mg total) by mouth every 12 (twelve) hours as needed for anxiety.     Derwood Kaplan, MD 01/09/17 1910
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
## 2017-01-09 NOTE — ED Triage Notes (Signed)
Pt tearful in triage, reports having anxiety and passed out at work x 5 days ago, EMS came to see the pt and told her she had a panic attack, pt reports ongoing symptoms since that time, pt reports insomnia & no appetite and nausea with panic attacks, pt states, "I just lost everything I have." denies SI & HI

## 2017-01-09 NOTE — ED Notes (Signed)
Pt verbalized understanding discharge instructions and denies any further needs or questions at this time. VS stable, ambulatory and steady gait.   

## 2017-05-05 ENCOUNTER — Other Ambulatory Visit: Payer: Self-pay

## 2017-05-05 ENCOUNTER — Inpatient Hospital Stay (HOSPITAL_COMMUNITY)
Admission: AD | Admit: 2017-05-05 | Discharge: 2017-05-05 | Disposition: A | Discharge status: Home / Self Care | Payer: Self-pay | Source: Ambulatory Visit | Attending: Obstetrics and Gynecology | Admitting: Obstetrics and Gynecology

## 2017-05-05 DIAGNOSIS — Z3202 Encounter for pregnancy test, result negative: Secondary | ICD-10-CM | POA: Insufficient documentation

## 2017-05-05 DIAGNOSIS — N939 Abnormal uterine and vaginal bleeding, unspecified: Secondary | ICD-10-CM | POA: Insufficient documentation

## 2017-05-05 DIAGNOSIS — M545 Low back pain: Secondary | ICD-10-CM

## 2017-05-05 DIAGNOSIS — N938 Other specified abnormal uterine and vaginal bleeding: Secondary | ICD-10-CM

## 2017-05-05 LAB — URINALYSIS, ROUTINE W REFLEX MICROSCOPIC
BILIRUBIN URINE: NEGATIVE
Glucose, UA: NEGATIVE mg/dL
KETONES UR: NEGATIVE mg/dL
LEUKOCYTES UA: NEGATIVE
Nitrite: NEGATIVE
Protein, ur: NEGATIVE mg/dL
Specific Gravity, Urine: 1.005 (ref 1.005–1.030)
pH: 6 (ref 5.0–8.0)

## 2017-05-05 LAB — HCG, SERUM, QUALITATIVE: Preg, Serum: NEGATIVE

## 2017-05-05 LAB — POCT PREGNANCY, URINE: PREG TEST UR: NEGATIVE

## 2017-05-05 NOTE — MAU Note (Signed)
Pt presents with c/o VB that began Friday, that stopped but has since started spotting. Reports took UPT, negative.  States she has signs/symtoms of pregnancy such as breast tenderness, stomach bloated, infrequent nausea and vomiting.  Pt states also having lower back pain, relieved with Advil.

## 2017-05-05 NOTE — MAU Provider Note (Signed)
Ms. Erika Reese is a 36 y.o.  who present to MAU today for possible pregnancy, vaginal bleeding and "feeling like she is pregnant with weight gain and bloating". She denies abdominal pain. States she is having back pain that is chronic and relieved by advil. She is concerned for tubal pregnancy with hx of BTL in [place]. Declines testing for STD   Review of Systems: Positive vaginal bleeding  Positive back pain  No abdominal pain   Physical exam:  BP 118/66 (BP Location: Right Arm)   Pulse 73   Temp 98 F (36.7 C) (Oral)   Resp 18   Ht 5\' 4"  (1.626 m)   Wt 125 lb 12 oz (57 kg)   LMP 03/23/2017 (Approximate)   SpO2 100%   BMI 21.58 kg/m  CONSTITUTIONAL: Well-developed, well-nourished female in no acute distress.  CARDIOVASCULAR: Normal heart rate noted RESPIRATORY: Effort and breath sounds normal GASTROINTESTINAL:Soft, no distention noted.  No tenderness, rebound or guarding.  SKIN: Skin is warm and dry. No rash noted. Not diaphoretic. No erythema. No pallor. PSYCHIATRIC: Normal mood and affect. Normal behavior. Normal judgment and thought content.  MDM Urinalysis  UPT - negative  HCG qualitative -negative  Medical screening exam complete  A:  Pregnancy examination or test, negative result  Vaginal bleeding   P: Discharge home Patient advised that she can present as a walk-in to [facility] for a pregnancy test M-Th between 8am-4pm or Friday between 8am -11am Reasons to return to MAU reviewed  Patient may return to MAU as needed or if her condition were to change or worsen  Mcneil SoberRogers, Steward Sames C, CNM 05/05/2017 3:39 PM

## 2017-05-05 NOTE — Discharge Instructions (Signed)
In late 2019, the [facility] will be moving to the [facility]. At that time, the MAU (Maternity Admissions Unit), where you are being seen today, will no longer take care of non-pregnant patients. We strongly encourage you to find a doctor's office before that time, so that you can be seen with any GYN concerns, like vaginal discharge, urinary tract infection, etc.. in a timely manner. ° °In order to make an office visit more convenient, the [facility] will be offering evening hours with same-day appointments, walk-in appointments and scheduled appointments available during this time. ° °[facility] Hours: °Monday - 8am - 7:30 pm with walk-in between 4pm- 7:30 pm °Tuesday - 8 am - 5 pm (starting 08/18/17 we will be open late and accepting walk-ins from 4pm - 7:30pm) °Wednesday - 8 am - 5 pm (starting 11/18/17 we will be open late and accepting walk-ins from 4pm - 7:30pm) °Thursday 8 am - 5 pm (starting 02/18/18 we will be open late and accepting walk-ins from 4pm - 7:30pm) °Friday 8 am - 5 pm ° °For an appointment please call the [facility] at [phone] ° °For urgent needs, [facility] is also available for management of urgent GYN complaints such as vaginal discharge or urinary tract infections. ° ° ° ° ° °

## 2017-09-09 ENCOUNTER — Emergency Department (HOSPITAL_COMMUNITY)
Admission: EM | Admit: 2017-09-09 | Discharge: 2017-09-09 | Disposition: A | Discharge status: Home / Self Care | Payer: Self-pay | Attending: Emergency Medicine | Admitting: Emergency Medicine

## 2017-09-09 ENCOUNTER — Other Ambulatory Visit: Payer: Self-pay

## 2017-09-09 ENCOUNTER — Encounter (HOSPITAL_COMMUNITY): Payer: Self-pay | Admitting: Emergency Medicine

## 2017-09-09 DIAGNOSIS — R197 Diarrhea, unspecified: Secondary | ICD-10-CM | POA: Insufficient documentation

## 2017-09-09 DIAGNOSIS — R5383 Other fatigue: Secondary | ICD-10-CM | POA: Insufficient documentation

## 2017-09-09 DIAGNOSIS — Z5321 Procedure and treatment not carried out due to patient leaving prior to being seen by health care provider: Secondary | ICD-10-CM | POA: Insufficient documentation

## 2017-09-09 DIAGNOSIS — R112 Nausea with vomiting, unspecified: Secondary | ICD-10-CM | POA: Insufficient documentation

## 2017-09-09 LAB — COMPREHENSIVE METABOLIC PANEL
ALBUMIN: 4.6 g/dL (ref 3.5–5.0)
ALK PHOS: 42 U/L (ref 38–126)
ALT: 20 U/L (ref 14–54)
ANION GAP: 10 (ref 5–15)
AST: 24 U/L (ref 15–41)
BUN: 10 mg/dL (ref 6–20)
CALCIUM: 9.7 mg/dL (ref 8.9–10.3)
CO2: 21 mmol/L — ABNORMAL LOW (ref 22–32)
Chloride: 107 mmol/L (ref 101–111)
Creatinine, Ser: 0.79 mg/dL (ref 0.44–1.00)
GFR calc Af Amer: >60 mL/min (ref >60)
GFR calc non Af Amer: >60 mL/min (ref >60)
GLUCOSE: 105 mg/dL — AB (ref 65–99)
POTASSIUM: 4.5 mmol/L (ref 3.5–5.1)
SODIUM: 138 mmol/L (ref 135–145)
Total Bilirubin: 0.7 mg/dL (ref 0.3–1.2)
Total Protein: 7.5 g/dL (ref 6.5–8.1)

## 2017-09-09 LAB — CBC
HEMATOCRIT: 37.3 % (ref 36.0–46.0)
HEMOGLOBIN: 13.1 g/dL (ref 12.0–15.0)
MCH: 32.5 pg (ref 26.0–34.0)
MCHC: 35.1 g/dL (ref 30.0–36.0)
MCV: 92.6 fL (ref 78.0–100.0)
Platelets: 234 10*3/uL (ref 150–400)
RBC: 4.03 MIL/uL (ref 3.87–5.11)
RDW: 13.3 % (ref 11.5–15.5)
WBC: 9 10*3/uL (ref 4.0–10.5)

## 2017-09-09 LAB — I-STAT BETA HCG BLOOD, ED (MC, WL, AP ONLY): I-stat hCG, quantitative: <5 m[IU]/mL (ref <5)

## 2017-09-09 LAB — LIPASE, BLOOD: Lipase: 22 U/L (ref 11–51)

## 2017-09-09 NOTE — ED Notes (Signed)
Patient called x 3 in all areas of the lobby with no response.  Tech 1st made aware.

## 2017-09-09 NOTE — ED Notes (Signed)
No reply for vitals X3.

## 2017-09-09 NOTE — ED Triage Notes (Signed)
Patient presents to ED for assessment of fatigue, a "churning" feeling in her stomach with nausea every time she eats, episodes of diarrhea, and vomiting.  Denies abdominal pain

## 2017-09-09 NOTE — ED Notes (Signed)
No reply for vitals x3. 

## 2018-06-19 ENCOUNTER — Encounter (HOSPITAL_COMMUNITY): Payer: Self-pay

## 2018-06-19 ENCOUNTER — Emergency Department (HOSPITAL_COMMUNITY)
Admission: EM | Admit: 2018-06-19 | Discharge: 2018-06-19 | Disposition: A | Discharge status: Home / Self Care | Payer: Self-pay | Attending: Emergency Medicine | Admitting: Emergency Medicine

## 2018-06-19 ENCOUNTER — Other Ambulatory Visit: Payer: Self-pay

## 2018-06-19 DIAGNOSIS — K029 Dental caries, unspecified: Secondary | ICD-10-CM

## 2018-06-19 DIAGNOSIS — K0889 Other specified disorders of teeth and supporting structures: Secondary | ICD-10-CM | POA: Insufficient documentation

## 2018-06-19 DIAGNOSIS — F17210 Nicotine dependence, cigarettes, uncomplicated: Secondary | ICD-10-CM | POA: Insufficient documentation

## 2018-06-19 HISTORY — DX: Anemia, unspecified: D64.9

## 2018-06-19 MED ORDER — HYDROCODONE-ACETAMINOPHEN 5-325 MG PO TABS
1.0000 | ORAL_TABLET | Freq: Once | ORAL | Status: AC
  Administered 2018-06-19: 1 via ORAL
  Filled 2018-06-19: qty 1

## 2018-06-19 MED ORDER — PENICILLIN V POTASSIUM 500 MG PO TABS: 500.0000 mg | ORAL_TABLET | Freq: Four times a day (QID) | ORAL | 0 refills | Status: DC

## 2018-06-19 NOTE — ED Provider Notes (Signed)
[facility] EMERGENCY DEPARTMENT Provider Note   CSN: [id number] Arrival date & time: 06/19/18  1415     History   Chief Complaint Chief Complaint  Patient presents with  . Dental Pain    HPI Erika Reese is a 38 y.o. female presenting to the emergency department with complaint of right-sided lower dental pain for 2 days.  Patient states she has been having intermittent issues w this tooth for over a year now.  She does have a dentist, however they were not open on the weekend.  She states pain has been constant with minimal relief with NSAIDs.  States the [place] pharmacy instructed her to take naproxen and ibuprofen together for her symptoms.  Pain radiates throughout her right face.  No fevers, difficulty breathing or swallowing, or drainage in the mouth.  No recent antibiotics.  She does smoke black and milds daily.  The history is provided by the patient.    Past Medical History:  Diagnosis Date  . Anemia     There are no active problems to display for this patient.   History reviewed. No pertinent surgical history.   OB History   No obstetric history on file.      Home Medications    Prior to Admission medications   Medication Sig Start Date End Date Taking? Authorizing Provider  penicillin v potassium (VEETID) 500 MG tablet Take 1 tablet (500 mg total) by mouth 4 (four) times daily for 7 days. 06/19/18 06/26/18  Maurisha Mongeau, [place] N, PA-C    Family History History reviewed. No pertinent family history.  Social History Social History   Tobacco Use  . Smoking status: Current Every Day Smoker    Packs/day: 0.25    Types: Cigarettes  . Smokeless tobacco: Never Used  Substance Use Topics  . Alcohol use: Yes  . Drug use: No     Allergies   Patient has no known allergies.   Review of Systems Review of Systems  Constitutional: Negative for chills and fever.  HENT: Positive for dental problem. Negative for sore throat, trouble  swallowing and voice change.      Physical Exam Updated Vital Signs BP 128/80 (BP Location: Right Arm)   Pulse 97   Temp 98.9 F (37.2 C) (Oral)   Resp 18   Ht 5\' 4"  (1.626 m)   Wt 54.4 kg   SpO2 96%   BMI 20.60 kg/m   Physical Exam Vitals signs and nursing note reviewed.  Constitutional:      Appearance: She is well-developed.  HENT:     Head: Normocephalic and atraumatic.     Mouth/Throat:      Comments: Tolerating secretions.  Right lower first molar appears to be missing with some tenderness to the gingiva.  Right second lower molar with tenderness and dental caries.  No fluctuance to the gingiva.  No sublingual edema or tenderness.  Uvula is midline, no trismus.  Left upper first molar with decay, decay appears to extend/track into the hard palate, however there is not any erythema or swelling to the gingiva surrounding this.  No drainage. Eyes:     Conjunctiva/sclera: Conjunctivae normal.  Pulmonary:     Effort: Pulmonary effort is normal.  Neurological:     Mental Status: She is alert.  Psychiatric:        Mood and Affect: Mood normal.        Behavior: Behavior normal.      ED Treatments / Results  Labs (all  labs ordered are listed, but only abnormal results are displayed) Labs Reviewed - No data to display  EKG None  Radiology No results found.  Procedures Procedures (including critical care time)  Medications Ordered in ED Medications  HYDROcodone-acetaminophen (NORCO/VICODIN) 5-325 MG per tablet 1 tablet (1 tablet Oral Given 06/19/18 1451)     Initial Impression / Assessment and Plan / ED Course  I have reviewed the triage vital signs and the nursing notes.  Pertinent labs & imaging results that were available during my care of the patient were reviewed by me and considered in my medical decision making (see chart for details).     Patient with dental caries.  No gross abscess.  VSS, afebrile, tolerating secretions. Exam unconcerning for  peritonsillar abscess, Ludwig's angina.  Patient discussed with Dr. Rosalia Hammersay.  Will treat with penicillin and pain medicine.  Urged patient to follow-up with her dentist. Pt safe for discharge.  Discussed results, findings, treatment and follow up. Patient advised of return precautions. Patient verbalized understanding and agreed with plan.  Final Clinical Impressions(s) / ED Diagnoses   Final diagnoses:  Pain due to dental caries    ED Discharge Orders         Ordered    penicillin v potassium (VEETID) 500 MG tablet  4 times daily     06/19/18 1441           Ison Wichmann, [place] N, [place]-C 06/19/18 1459    Margarita Grizzleay, Danielle, MD 06/19/18 [phone]

## 2018-06-19 NOTE — ED Notes (Signed)
Patient verbalizes understanding of discharge instructions. Opportunity for questioning and answers were provided. Armband removed by staff, pt discharged from ED ambulatory w/ family. goodrx provided for prescription

## 2018-06-19 NOTE — ED Triage Notes (Signed)
Pt arrives POV for eval of R sided dental pain. Pt reports onset 2 days ago. Was seen at dentist office last month and had tooth pulled but was told there were not other problems at that time. Pt reports trying NSAIDs and ice w/ minimal relief but swelling and pain are getting worse. Pt reports pain to entire R side of face. No resp distress/managing secretions well.

## 2018-06-19 NOTE — Discharge Instructions (Addendum)
Please read instructions below. °Take the antibiotic, Penicillin V, 4 times per day until they are gone. °You can take Naproxen up to 2 times per day with meals, as needed for pain. °Schedule an appointment with a dentist, using the dental resource guide attached. °Return to the ER for difficulty swallowing or breathing, fever, or new or worsening symptoms. ° °

## 2018-06-23 ENCOUNTER — Other Ambulatory Visit: Payer: Self-pay

## 2018-06-23 ENCOUNTER — Encounter (HOSPITAL_COMMUNITY): Payer: Self-pay | Admitting: Emergency Medicine

## 2018-06-23 ENCOUNTER — Emergency Department (HOSPITAL_COMMUNITY)
Admission: EM | Admit: 2018-06-23 | Discharge: 2018-06-23 | Disposition: A | Discharge status: Home / Self Care | Payer: Self-pay | Attending: Emergency Medicine | Admitting: Emergency Medicine

## 2018-06-23 DIAGNOSIS — K047 Periapical abscess without sinus: Secondary | ICD-10-CM | POA: Insufficient documentation

## 2018-06-23 DIAGNOSIS — F17210 Nicotine dependence, cigarettes, uncomplicated: Secondary | ICD-10-CM | POA: Insufficient documentation

## 2018-06-23 MED ORDER — ONDANSETRON 4 MG PO TBDP: 4.0000 mg | ORAL_TABLET | Freq: Three times a day (TID) | ORAL | 0 refills | Status: DC | PRN

## 2018-06-23 MED ORDER — LIDOCAINE VISCOUS HCL 2 % MT SOLN: 15.0000 mL | OROMUCOSAL | 2 refills | Status: AC | PRN

## 2018-06-23 MED ORDER — AMOXICILLIN-POT CLAVULANATE 875-125 MG PO TABS: 1.0000 | ORAL_TABLET | Freq: Two times a day (BID) | ORAL | 0 refills | Status: AC

## 2018-06-23 NOTE — Discharge Instructions (Addendum)
°  Dental Pain You have been seen today for dental pain. You should follow up with a dentist as soon as possible. This problem will not resolve on its own without the care of a dentist. Use ibuprofen or naproxen for pain. Use the viscous lidocaine for mouth pain. Swish with the lidocaine and spit it out. Do not swallow it. You should also swish with a homemade salt water solution, twice a day.  Make this solution by mixing 8 ounces of warm water with about half a teaspoon of salt. Antiinflammatory medications: Take 600 mg of ibuprofen every 6 hours or 440 mg (over the counter dose) to 500 mg (prescription dose) of naproxen every 12 hours for the next 3 days. After this time, these medications may be used as needed for pain. Take these medications with food to avoid upset stomach. Choose only one of these medications, do not take them together. Acetaminophen (generic for Tylenol): Should you continue to have additional pain while taking the ibuprofen or naproxen, you may add in acetaminophen as needed. Your daily total maximum amount of acetaminophen from all sources should be limited to 4000mg /day for persons without liver problems, or 2000mg /day for those with liver problems.  Please take all of your antibiotics until finished!   You may develop abdominal discomfort or diarrhea from the antibiotic.  You may help offset this with probiotics which you can buy or get in yogurt. Do not eat or take the probiotics until 2 hours after your antibiotic.   Stop taking the penicillin and begin taking the amoxicillin-clavulanate.   For prescription assistance, may try using prescription discount sites or apps, such as goodrx.com  [employer] [address] [place], [place] [address] [phone]  [facility] [address] [place], [place] [address] [phone]  [facility] [address] [place], [place] [address] [phone]  [facility] [address] [place], [place] [address] [phone]  [facility] [address] [place], [place] [address] [phone]  [facility] [employer] [email or web address]  [employer] [facility] [address] [place], [place] [address] [phone]  Website for free, low-income, or sliding scale dental services in [place]: www.freedentalcare.[place]  To find a dentist in [place] and surrounding areas: GuyGalaxy.[email or web address]  [facility] [email or web address]  [facility] Medicaid Dentist [email or web address]

## 2018-06-23 NOTE — ED Provider Notes (Signed)
[facility] EMERGENCY DEPARTMENT Provider Note   CSN: [id number] Arrival date & time: 06/23/18  0909     History   Chief Complaint Chief Complaint  Patient presents with  . Dental Pain    HPI Erika Reese is a 38 y.o. female.  HPI   Erika Reese is a 38 y.o. female, with a history of anemia, presenting to the ED with right lower dental pain for the last week.  Pain is aching, moderate to severe, radiating along the lower right jaw.  Endorses increased swelling as well as nausea and vomiting over the last couple days.  Patient was seen in the ED February 1 and prescribed penicillin.  She has been taking this medication as prescribed.   She states she has not yet set up an appointment with a dentist.  States she was under the impression she needed to have no swelling before the dentist saw her. Denies fever/chills, difficulty breathing or swallowing, drainage from the region, or any other complaints.    Past Medical History:  Diagnosis Date  . Anemia     There are no active problems to display for this patient.   History reviewed. No pertinent surgical history.   OB History   No obstetric history on file.      Home Medications    Prior to Admission medications   Medication Sig Start Date End Date Taking? Authorizing Provider  amoxicillin-clavulanate (AUGMENTIN) 875-125 MG tablet Take 1 tablet by mouth every 12 (twelve) hours. 06/23/18   ,  C, PA-C  lidocaine (XYLOCAINE) 2 % solution Use as directed 15 mLs in the mouth or throat as needed for mouth pain. 06/23/18   ,  C, PA-C  ondansetron (ZOFRAN ODT) 4 MG disintegrating tablet Take 1 tablet (4 mg total) by mouth every 8 (eight) hours as needed for nausea or vomiting. 06/23/18   Anselm Pancoast, PA-C    Family History History reviewed. No pertinent family history.  Social History Social History   Tobacco Use  . Smoking status: Current Every Day Smoker    Packs/day: 0.25   Types: Cigarettes  . Smokeless tobacco: Never Used  Substance Use Topics  . Alcohol use: Yes  . Drug use: No     Allergies   Patient has no known allergies.   Review of Systems Review of Systems  Constitutional: Negative for fever.  HENT: Positive for dental problem and facial swelling. Negative for drooling, sore throat, trouble swallowing and voice change.   Respiratory: Negative for shortness of breath.   Cardiovascular: Negative for chest pain.  Gastrointestinal: Positive for nausea and vomiting. Negative for abdominal pain.  Musculoskeletal: Negative for neck pain and neck stiffness.     Physical Exam Updated Vital Signs BP 131/86 (BP Location: Right Arm)   Pulse 88   Temp 98.5 F (36.9 C) (Oral)   Resp 16   Ht 5\' 4"  (1.626 m)   Wt 54.4 kg   LMP 06/03/2018 (Approximate)   SpO2 99%   BMI 20.60 kg/m   Physical Exam Vitals signs and nursing note reviewed.  Constitutional:      General: She is not in acute distress.    Appearance: She is well-developed. She is not diaphoretic.  HENT:     Head: Normocephalic and atraumatic.     Comments: Tenderness to the region of the right mandibular premolars.  She does have some swelling to the right side of the face.  Swelling does not appear to cross the  midline.  No sublingual swelling.  Dentition appears to be stable.  No noted fluctuance.  No trismus.  Mouth opening to at least 3 finger widths.  Handles oral secretions without difficulty.  No swelling or tenderness to the submental or submandibular regions.  No swelling or tenderness into the soft tissues of the neck. Eyes:     Conjunctiva/sclera: Conjunctivae normal.  Neck:     Musculoskeletal: Neck supple.  Cardiovascular:     Rate and Rhythm: Normal rate and regular rhythm.  Pulmonary:     Effort: Pulmonary effort is normal.  Skin:    General: Skin is warm and dry.     Coloration: Skin is not pale.  Neurological:     Mental Status: She is alert.  Psychiatric:         Behavior: Behavior normal.      ED Treatments / Results  Labs (all labs ordered are listed, but only abnormal results are displayed) Labs Reviewed - No data to display  EKG None  Radiology No results found.  Procedures Procedures (including critical care time)  Medications Ordered in ED Medications - No data to display   Initial Impression / Assessment and Plan / ED Course  I have reviewed the triage vital signs and the nursing notes.  Pertinent labs & imaging results that were available during my care of the patient were reviewed by me and considered in my medical decision making (see chart for details).     Patient presents with right-sided facial swelling.  Swelling has increased despite taking the penicillin.  Low suspicion for Ludwig's angioedema or sepsis.  She was referred to the oral surgeon on call. The patient was given instructions for home care as well as return precautions. Patient voices understanding of these instructions, accepts the plan, and is comfortable with discharge.    Final Clinical Impressions(s) / ED Diagnoses   Final diagnoses:  Dental abscess    ED Discharge Orders         Ordered    amoxicillin-clavulanate (AUGMENTIN) 875-125 MG tablet  Every 12 hours     06/23/18 1008    lidocaine (XYLOCAINE) 2 % solution  As needed     06/23/18 1008    ondansetron (ZOFRAN ODT) 4 MG disintegrating tablet  Every 8 hours PRN     06/23/18 1013           Anselm Pancoast, PA-C 06/23/18 1444    Alvira Monday, MD 06/25/18 2146

## 2018-06-23 NOTE — ED Triage Notes (Signed)
Pt was seen on Saturday 2/1 for left sided dental pain and started on oral penicillin- pt states when she takes this 0 minutes later she breaks out into a sweat and vomits. Pt has not taken it today but has but nauseous and vomited once today.   Pt now has left jaw swelling and increased pain since Saturday.

## 2018-06-23 NOTE — ED Notes (Signed)
Unable to keep antibiotic treatment down due to vomiting

## 2018-08-24 ENCOUNTER — Ambulatory Visit: Payer: Self-pay | Admitting: General Practice

## 2018-08-24 ENCOUNTER — Encounter (HOSPITAL_COMMUNITY): Payer: Self-pay

## 2018-08-24 ENCOUNTER — Other Ambulatory Visit: Payer: Self-pay

## 2018-08-24 ENCOUNTER — Emergency Department (HOSPITAL_COMMUNITY)
Admission: EM | Admit: 2018-08-24 | Discharge: 2018-08-24 | Disposition: A | Discharge status: Home / Self Care | Payer: Self-pay | Attending: Emergency Medicine | Admitting: Emergency Medicine

## 2018-08-24 DIAGNOSIS — R0602 Shortness of breath: Secondary | ICD-10-CM | POA: Insufficient documentation

## 2018-08-24 DIAGNOSIS — F419 Anxiety disorder, unspecified: Secondary | ICD-10-CM | POA: Insufficient documentation

## 2018-08-24 DIAGNOSIS — F17210 Nicotine dependence, cigarettes, uncomplicated: Secondary | ICD-10-CM | POA: Insufficient documentation

## 2018-08-24 DIAGNOSIS — R05 Cough: Secondary | ICD-10-CM | POA: Insufficient documentation

## 2018-08-24 MED ORDER — ALBUTEROL SULFATE HFA 108 (90 BASE) MCG/ACT IN AERS: 1.0000 | INHALATION_SPRAY | Freq: Four times a day (QID) | RESPIRATORY_TRACT | 0 refills | Status: AC | PRN

## 2018-08-24 NOTE — Telephone Encounter (Signed)
sPt. Reports she has been sick 3-4 days with cough, shortness of breath. States her breathing is getting worse. Denies fever. Does not have a pcp. Is concerned about her breathing. States she is going to [facility] for her shortness of breath. States she has tried breathing in a paper bag and it didn't help. Spoke with Shanda Bumps in [place] ED.  Answer Assessment - Initial Assessment Questions 1. COVID-19 DIAGNOSIS: "Who made your Coronavirus (COVID-19) diagnosis?" "Was it confirmed by a positive lab test?" If not diagnosed by a HCP, ask "Are there lots of cases (community spread) where you live?" (See public health department website, if unsure)   * MAJOR community spread: high number of cases; numbers of cases are increasing; many people hospitalized.   * MINOR community spread: low number of cases; not increasing; few or no people hospitalized     No diagnosis 2. ONSET: "When did the COVID-19 symptoms start?"      3-4 days ago 3. WORST SYMPTOM: "What is your worst symptom?" (e.g., cough, fever, shortness of breath, muscle aches)     Shortness of breath 4. COUGH: "How bad is the cough?"       Moderate 5. FEVER: "Do you have a fever?" If so, ask: "What is your temperature, how was it measured, and when did it start?"     No 6. RESPIRATORY STATUS: "Describe your breathing?" (e.g., shortness of breath, wheezing, unable to speak)      Shortness of breath getting worse 7. BETTER-SAME-WORSE: "Are you getting better, staying the same or getting worse compared to yesterday?"  If getting worse, ask, "In what way?"     worse 8. HIGH RISK DISEASE: "Do you have any chronic medical problems?" (e.g., asthma, heart or lung disease, weak immune system, etc.)     No 9. PREGNANCY: "Is there any chance you are pregnant?" "When was your last menstrual period?"     no 10. OTHER SYMPTOMS: "Do you have any other symptoms?"  (e.g., runny nose, headache, sore throat, loss of smell)       No  Protocols used:  CORONAVIRUS (COVID-19) DIAGNOSED OR SUSPECTED-A-AH

## 2018-08-24 NOTE — Discharge Instructions (Addendum)
Please read attached information. If you experience any new or worsening signs or symptoms please return to the emergency room for evaluation. Please follow-up with your primary care provider or specialist as discussed. Please use medication prescribed only as directed and discontinue taking if you have any concerning signs or symptoms.   °

## 2018-08-24 NOTE — ED Notes (Signed)
Patient verbalizes understanding of discharge instructions. Opportunity for questioning and answers were provided. Armband removed by staff, pt discharged from ED. Pt ambulatory to lobby. Prescriptions and follow up care reviewed.  

## 2018-08-24 NOTE — ED Triage Notes (Signed)
Pt endorses SOB x several days. Pt denies pain. Pt is alert, oriented, and ambulatory.

## 2018-08-24 NOTE — ED Notes (Signed)
ED Provider at bedside. 

## 2018-08-24 NOTE — ED Provider Notes (Signed)
[facility] Provider Note   CSN: [id number] Arrival date & time: 08/24/18  1402    History   Chief Complaint Chief Complaint  Patient presents with  . Shortness of Breath    HPI Erika Reese is a 38 y.o. female.     HPI   38 year old female presents today with complaints of shortness of breath.  Patient has a history of bronchitis feels like she is having some tightness and shortness of breath secondary to bronchitis.  She notes a minor cough.  She denies any fever.  She has noted some anxiety.  She denies any chest pain, lower extremity swelling or edema, history of DVT or PE or any significant risk factors.  She notes she occasionally smokes.  No close sick contacts with known coronavirus.  Past Medical History:  Diagnosis Date  . Anemia     There are no active problems to display for this patient.   History reviewed. No pertinent surgical history.   OB History   No obstetric history on file.      Home Medications    Prior to Admission medications   Medication Sig Start Date End Date Taking? Authorizing Provider  albuterol (PROVENTIL HFA;VENTOLIN HFA) 108 (90 Base) MCG/ACT inhaler Inhale 1-2 puffs into the lungs every 6 (six) hours as needed for wheezing or shortness of breath. 08/24/18   Lanasia Porras, Tinnie GensJeffrey, PA-C  amoxicillin-clavulanate (AUGMENTIN) 875-125 MG tablet Take 1 tablet by mouth every 12 (twelve) hours. 06/23/18   Joy, Shawn C, PA-C  lidocaine (XYLOCAINE) 2 % solution Use as directed 15 mLs in the mouth or throat as needed for mouth pain. 06/23/18   Joy, Shawn C, PA-C  ondansetron (ZOFRAN ODT) 4 MG disintegrating tablet Take 1 tablet (4 mg total) by mouth every 8 (eight) hours as needed for nausea or vomiting. 06/23/18   Anselm PancoastJoy, Shawn C, PA-C    Family History History reviewed. No pertinent family history.  Social History Social History   Tobacco Use  . Smoking status: Current Every Day Smoker    Packs/day: 0.25   Types: Cigarettes  . Smokeless tobacco: Never Used  Substance Use Topics  . Alcohol use: Yes  . Drug use: No     Allergies   Patient has no known allergies.   Review of Systems Review of Systems  All other systems reviewed and are negative.    Physical Exam Updated Vital Signs BP 118/80 (BP Location: Right Arm)   Pulse 84   Temp 98.6 F (37 C) (Oral)   Resp 16   SpO2 99%   Physical Exam Vitals signs and nursing note reviewed.  Constitutional:      Appearance: She is well-developed.  HENT:     Head: Normocephalic and atraumatic.  Eyes:     General: No scleral icterus.       Right eye: No discharge.        Left eye: No discharge.     Conjunctiva/sclera: Conjunctivae normal.     Pupils: Pupils are equal, round, and reactive to light.  Neck:     Musculoskeletal: Normal range of motion.     Vascular: No JVD.     Trachea: No tracheal deviation.  Pulmonary:     Effort: Pulmonary effort is normal. No respiratory distress.     Breath sounds: No stridor. No wheezing or rales.  Neurological:     Mental Status: She is alert and oriented to person, place, and time.     Coordination:  Coordination normal.  Psychiatric:        Behavior: Behavior normal.        Thought Content: Thought content normal.        Judgment: Judgment normal.      ED Treatments / Results  Labs (all labs ordered are listed, but only abnormal results are displayed) Labs Reviewed - No data to display  EKG None  Radiology No results found.  Procedures Procedures (including critical care time)  Medications Ordered in ED Medications - No data to display   Initial Impression / Assessment and Plan / ED Course  I have reviewed the triage vital signs and the nursing notes.  Pertinent labs & imaging results that were available during my care of the patient were reviewed by me and considered in my medical decision making (see chart for details).        Labs:   Imaging:  Consults:   Therapeutics:  Discharge Meds:albuterol  Assessment/Plan: 38 year old female presents today for complaints of shortness of breath.  Patient has no signs of shortness of breath on my exam, she is well-appearing in no acute distress.  Her oxygen is 99% her heart rate is in the 70s.  I have very low suspicion for PE, PERC negative, Wells 0.  She has no signs of acute exacerbation of her bronchitis, no signs of respiratory distress or infectious etiology presently.  Patient will be discharged with symptomatic care and strict return precautions.  She verbalized understanding and agreement to today's plan had no further questions or concerns.      Final Clinical Impressions(s) / ED Diagnoses   Final diagnoses:  Shortness of breath    ED Discharge Orders         Ordered    albuterol (PROVENTIL HFA;VENTOLIN HFA) 108 (90 Base) MCG/ACT inhaler  Every 6 hours PRN     08/24/18 1422           Eyvonne Mechanic, PA-C 08/24/18 1423    Shaune Pollack, MD 08/25/18 [phone]

## 2018-12-10 ENCOUNTER — Other Ambulatory Visit: Payer: Self-pay

## 2018-12-10 ENCOUNTER — Encounter (HOSPITAL_COMMUNITY): Payer: Self-pay | Admitting: Emergency Medicine

## 2018-12-10 ENCOUNTER — Emergency Department (HOSPITAL_COMMUNITY)
Admission: EM | Admit: 2018-12-10 | Discharge: 2018-12-10 | Disposition: A | Discharge status: Home / Self Care | Payer: Self-pay | Attending: Emergency Medicine | Admitting: Emergency Medicine

## 2018-12-10 DIAGNOSIS — Y9389 Activity, other specified: Secondary | ICD-10-CM | POA: Insufficient documentation

## 2018-12-10 DIAGNOSIS — Z23 Encounter for immunization: Secondary | ICD-10-CM | POA: Insufficient documentation

## 2018-12-10 DIAGNOSIS — Y929 Unspecified place or not applicable: Secondary | ICD-10-CM | POA: Insufficient documentation

## 2018-12-10 DIAGNOSIS — Y999 Unspecified external cause status: Secondary | ICD-10-CM | POA: Insufficient documentation

## 2018-12-10 DIAGNOSIS — S81812A Laceration without foreign body, left lower leg, initial encounter: Secondary | ICD-10-CM | POA: Insufficient documentation

## 2018-12-10 DIAGNOSIS — F17210 Nicotine dependence, cigarettes, uncomplicated: Secondary | ICD-10-CM | POA: Insufficient documentation

## 2018-12-10 DIAGNOSIS — W260XXA Contact with knife, initial encounter: Secondary | ICD-10-CM | POA: Insufficient documentation

## 2018-12-10 MED ORDER — LIDOCAINE-EPINEPHRINE (PF) 2 %-1:200000 IJ SOLN
20.0000 mL | Freq: Once | INTRAMUSCULAR | Status: AC
  Administered 2018-12-10: 20 mL via INTRADERMAL
  Filled 2018-12-10: qty 20

## 2018-12-10 MED ORDER — TETANUS-DIPHTH-ACELL PERTUSSIS 5-2.5-18.5 LF-MCG/0.5 IM SUSP
0.5000 mL | Freq: Once | INTRAMUSCULAR | Status: AC
  Administered 2018-12-10: 0.5 mL via INTRAMUSCULAR
  Filled 2018-12-10: qty 0.5

## 2018-12-10 NOTE — ED Notes (Signed)
Patient verbalizes understanding of discharge instructions. Opportunity for questioning and answers were provided. Armband removed by staff, pt discharged from ED.  

## 2018-12-10 NOTE — Discharge Instructions (Signed)
Keep area clean. Do not get wet for 24 hours. After you can wash with soap and water daily. Apply a bandage at least once daily, change more often if it is dirty Watch for signs of infection (redness, drainage, worsening pain) Have stitches removed in 7-10 days

## 2018-12-10 NOTE — ED Provider Notes (Signed)
[place] EMERGENCY DEPARTMENT Provider Note   CSN: [id number] Arrival date & time: 12/10/18  1129     History   Chief Complaint Chief Complaint  Patient presents with  . Extremity Laceration    HPI Erika Reese is a 38 y.o. female who presents with a left leg laceration.  No significant past medical history.  The patient states that she was opening boxes using a box cutter just prior to arrival.  The box was on her lap and when she used the box cutter it cut her left leg over the medial aspect of her leg just above her knee.  She immediately came to the emergency department.  She says the box cutter was new and she is not worried about any contamination.  She is unsure of her tetanus status.  She is ambulatory.     HPI  Past Medical History:  Diagnosis Date  . Anemia     There are no active problems to display for this patient.   History reviewed. No pertinent surgical history.   OB History   No obstetric history on file.      Home Medications    Prior to Admission medications   Medication Sig Start Date End Date Taking? Authorizing Provider  albuterol (PROVENTIL HFA;VENTOLIN HFA) 108 (90 Base) MCG/ACT inhaler Inhale 1-2 puffs into the lungs every 6 (six) hours as needed for wheezing or shortness of breath. 08/24/18   Hedges, Dellis Filbert, PA-C  amoxicillin-clavulanate (AUGMENTIN) 875-125 MG tablet Take 1 tablet by mouth every 12 (twelve) hours. 06/23/18   Joy, Shawn C, PA-C  lidocaine (XYLOCAINE) 2 % solution Use as directed 15 mLs in the mouth or throat as needed for mouth pain. 06/23/18   Joy, Shawn C, PA-C  ondansetron (ZOFRAN ODT) 4 MG disintegrating tablet Take 1 tablet (4 mg total) by mouth every 8 (eight) hours as needed for nausea or vomiting. 06/23/18   Joy, Helane Gunther, PA-C    Family History No family history on file.  Social History Social History   Tobacco Use  . Smoking status: Current Every Day Smoker    Packs/day: 0.25    Types:  Cigarettes  . Smokeless tobacco: Never Used  Substance Use Topics  . Alcohol use: Yes  . Drug use: No     Allergies   Patient has no known allergies.   Review of Systems Review of Systems  Musculoskeletal: Negative for arthralgias.  Skin: Positive for wound.     Physical Exam Updated Vital Signs BP 126/64 (BP Location: Right Arm)   Pulse 63   Temp 98.1 F (36.7 C) (Oral)   Resp 17   Ht 5\' 4"  (1.626 m)   Wt 54.4 kg   LMP 11/05/2018   BMI 20.60 kg/m   Physical Exam Vitals signs and nursing note reviewed.  Constitutional:      General: She is not in acute distress.    Appearance: Normal appearance. She is well-developed.  HENT:     Head: Normocephalic and atraumatic.  Eyes:     General: No scleral icterus.       Right eye: No discharge.        Left eye: No discharge.     Conjunctiva/sclera: Conjunctivae normal.     Pupils: Pupils are equal, round, and reactive to light.  Neck:     Musculoskeletal: Normal range of motion.  Cardiovascular:     Rate and Rhythm: Normal rate.  Pulmonary:     Effort: Pulmonary effort  is normal. No respiratory distress.  Abdominal:     General: There is no distension.  Musculoskeletal:     Comments: Left leg: ~4cm linear laceration over the left medial leg just above the knee. Bleeding is controlled.  Skin:    General: Skin is warm and dry.  Neurological:     Mental Status: She is alert and oriented to person, place, and time.  Psychiatric:        Behavior: Behavior normal.      ED Treatments / Results  Labs (all labs ordered are listed, but only abnormal results are displayed) Labs Reviewed - No data to display  EKG None  Radiology No results found.  Procedures .Marland Kitchen.Laceration Repair  Date/Time: 12/10/2018 1:54 PM Performed by: Bethel BornGekas, Daizy Outen Marie, PA-C Authorized by: Bethel BornGekas, Shalin Linders Marie, PA-C   Consent:    Consent obtained:  Verbal   Consent given by:  Patient   Risks discussed:  Infection and pain    Alternatives discussed:  No treatment Anesthesia (see MAR for exact dosages):    Anesthesia method:  Local infiltration   Local anesthetic:  Lidocaine 2% WITH epi Laceration details:    Location:  Leg   Leg location:  L knee   Length (cm):  4   Depth (mm):  3 Repair type:    Repair type:  Simple Pre-procedure details:    Preparation:  Patient was prepped and draped in usual sterile fashion Exploration:    Wound exploration: wound explored through full range of motion and entire depth of wound probed and visualized   Treatment:    Area cleansed with:  Saline   Amount of cleaning:  Standard   Irrigation solution:  Sterile saline   Irrigation method:  Syringe   Visualized foreign bodies/material removed: no   Skin repair:    Repair method:  Sutures   Suture size:  5-0   Suture material:  Prolene   Suture technique:  Simple interrupted   Number of sutures:  5 Approximation:    Approximation:  Close Post-procedure details:    Dressing:  Sterile dressing   Patient tolerance of procedure:  Tolerated well, no immediate complications   (including critical care time)    Medications Ordered in ED Medications  Tdap (BOOSTRIX) injection 0.5 mL (has no administration in time range)  lidocaine-EPINEPHrine (XYLOCAINE W/EPI) 2 %-1:200000 (PF) injection 20 mL (20 mLs Intradermal Given by Other 12/10/18 1312)     Initial Impression / Assessment and Plan / ED Course  I have reviewed the triage vital signs and the nursing notes.  Pertinent labs & imaging results that were available during my care of the patient were reviewed by me and considered in my medical decision making (see chart for details).  38 year old with left leg laceration. It was repaired and irrigated in the ED. Bottom of the wound visualized and bleeding controlled. 5 sutures placed. Wound care discussed and advised to return to have stitches removed in 7-10 days. Tdap was updated. Return precautions discussed.     Final Clinical Impressions(s) / ED Diagnoses   Final diagnoses:  Leg laceration, left, initial encounter    ED Discharge Orders    None       Bethel BornGekas, Jauna Raczynski Marie, PA-C 12/10/18 1355    Raeford RazorKohut, Stephen, MD 12/11/18 1538

## 2018-12-10 NOTE — ED Triage Notes (Signed)
Pt. Stated, I was cut by a box cutter . The box cutter was sitting on my lap.

## 2018-12-30 ENCOUNTER — Other Ambulatory Visit: Payer: Self-pay

## 2018-12-30 ENCOUNTER — Ambulatory Visit (HOSPITAL_COMMUNITY)
Admission: EM | Admit: 2018-12-30 | Discharge: 2018-12-30 | Disposition: A | Discharge status: Home / Self Care | Payer: Self-pay

## 2018-12-30 ENCOUNTER — Encounter (HOSPITAL_COMMUNITY): Payer: Self-pay | Admitting: Emergency Medicine

## 2018-12-30 DIAGNOSIS — Z4802 Encounter for removal of sutures: Secondary | ICD-10-CM

## 2018-12-30 NOTE — ED Triage Notes (Signed)
Pt here for suture removal to left knee; 5 sutures removed

## 2019-04-07 ENCOUNTER — Emergency Department (HOSPITAL_COMMUNITY): Payer: Self-pay

## 2019-04-07 ENCOUNTER — Emergency Department (HOSPITAL_COMMUNITY)
Admission: EM | Admit: 2019-04-07 | Discharge: 2019-04-07 | Disposition: A | Discharge status: Home / Self Care | Payer: Self-pay | Attending: Emergency Medicine | Admitting: Emergency Medicine

## 2019-04-07 ENCOUNTER — Other Ambulatory Visit: Payer: Self-pay

## 2019-04-07 DIAGNOSIS — S0990XA Unspecified injury of head, initial encounter: Secondary | ICD-10-CM | POA: Insufficient documentation

## 2019-04-07 DIAGNOSIS — Y929 Unspecified place or not applicable: Secondary | ICD-10-CM | POA: Insufficient documentation

## 2019-04-07 DIAGNOSIS — S0101XA Laceration without foreign body of scalp, initial encounter: Secondary | ICD-10-CM | POA: Insufficient documentation

## 2019-04-07 DIAGNOSIS — W208XXA Other cause of strike by thrown, projected or falling object, initial encounter: Secondary | ICD-10-CM | POA: Insufficient documentation

## 2019-04-07 DIAGNOSIS — Y93E9 Activity, other interior property and clothing maintenance: Secondary | ICD-10-CM | POA: Insufficient documentation

## 2019-04-07 DIAGNOSIS — Y999 Unspecified external cause status: Secondary | ICD-10-CM | POA: Insufficient documentation

## 2019-04-07 DIAGNOSIS — F17210 Nicotine dependence, cigarettes, uncomplicated: Secondary | ICD-10-CM | POA: Insufficient documentation

## 2019-04-07 MED ORDER — IBUPROFEN 600 MG PO TABS: 600.0000 mg | ORAL_TABLET | Freq: Four times a day (QID) | ORAL | 0 refills | Status: AC | PRN

## 2019-04-07 MED ORDER — IBUPROFEN 800 MG PO TABS
800.0000 mg | ORAL_TABLET | Freq: Once | ORAL | Status: AC
  Administered 2019-04-07: 800 mg via ORAL
  Filled 2019-04-07: qty 1

## 2019-04-07 MED ORDER — LIDOCAINE-EPINEPHRINE (PF) 2 %-1:200000 IJ SOLN
10.0000 mL | Freq: Once | INTRAMUSCULAR | Status: AC
  Administered 2019-04-07: 10 mL via INTRADERMAL
  Filled 2019-04-07: qty 20

## 2019-04-07 NOTE — ED Provider Notes (Signed)
[place] EMERGENCY DEPARTMENT Provider Note   CSN: [id number] Arrival date & time: 04/07/19  1522     History   Chief Complaint Chief Complaint  Patient presents with  . Head Injury    HPI Erika Reese is a 38 y.o. female.     The history is provided by the patient. No language interpreter was used.  Head Injury    38 year old female presenting c/o head injury.  Pt report approximately 20 minutes ago patient and her husband was trying to throw away some of his furniture.  In the process, the corner of a heavy table slipped and struck her right scalp causing a puncture wound.  She report acute onset of sharp throbbing nonradiating pain of moderate severity at the site with associate headache.  She endorsed mild nausea initially that is since resolved.  She does not complain of any loss of consciousness, confusion, neck pain, vision changes, focal numbness or weakness.  She is up-to-date with tetanus.  She recently had her menstruation several days prior.  She denies any specific treatment tried.  She is not on any blood thinner medication.    Past Medical History:  Diagnosis Date  . Anemia     There are no active problems to display for this patient.   No past surgical history on file.   OB History   No obstetric history on file.      Home Medications    Prior to Admission medications   Medication Sig Start Date End Date Taking? Authorizing Provider  albuterol (PROVENTIL HFA;VENTOLIN HFA) 108 (90 Base) MCG/ACT inhaler Inhale 1-2 puffs into the lungs every 6 (six) hours as needed for wheezing or shortness of breath. 08/24/18   Hedges, Dellis Filbert, PA-C  amoxicillin-clavulanate (AUGMENTIN) 875-125 MG tablet Take 1 tablet by mouth every 12 (twelve) hours. 06/23/18   Joy, Shawn C, PA-C  lidocaine (XYLOCAINE) 2 % solution Use as directed 15 mLs in the mouth or throat as needed for mouth pain. 06/23/18   Joy, Shawn C, PA-C  ondansetron (ZOFRAN ODT) 4 MG  disintegrating tablet Take 1 tablet (4 mg total) by mouth every 8 (eight) hours as needed for nausea or vomiting. 06/23/18   Joy, Helane Gunther, PA-C    Family History No family history on file.  Social History Social History   Tobacco Use  . Smoking status: Current Every Day Smoker    Packs/day: 0.25    Types: Cigarettes  . Smokeless tobacco: Never Used  Substance Use Topics  . Alcohol use: Yes  . Drug use: No     Allergies   Patient has no known allergies.   Review of Systems Review of Systems  All other systems reviewed and are negative.    Physical Exam Updated Vital Signs BP 129/83 (BP Location: Right Arm)   Pulse 73   Temp 98.4 F (36.9 C) (Oral)   Resp 14   Ht 5\' 4"  (1.626 m)   Wt 54.4 kg   LMP 03/31/2019 (Approximate)   SpO2 99%   BMI 20.60 kg/m   Physical Exam Vitals signs and nursing note reviewed.  Constitutional:      General: She is not in acute distress.    Appearance: She is well-developed.  HENT:     Head: Normocephalic.     Comments: 4cm crescent shaped laceration to R temporal region with tenderness to palpation.  No fb noted.  Eyes:     Conjunctiva/sclera: Conjunctivae normal.  Neck:  Musculoskeletal: Neck supple.  Cardiovascular:     Rate and Rhythm: Normal rate and regular rhythm.  Skin:    Findings: No rash.  Neurological:     Mental Status: She is alert and oriented to person, place, and time.     GCS: GCS eye subscore is 4. GCS verbal subscore is 5. GCS motor subscore is 6.     Cranial Nerves: Cranial nerves are intact.     Sensory: Sensation is intact.     Motor: Motor function is intact.     Comments: Alert and oriented x 4      ED Treatments / Results  Labs (all labs ordered are listed, but only abnormal results are displayed) Labs Reviewed - No data to display  EKG None  Radiology Ct Head Wo Contrast  Result Date: 04/07/2019 CLINICAL DATA:  Head trauma EXAM: CT HEAD WITHOUT CONTRAST TECHNIQUE: Contiguous axial  images were obtained from the base of the skull through the vertex without intravenous contrast. COMPARISON:  None. FINDINGS: Brain: No evidence of acute infarction, hemorrhage, hydrocephalus, extra-axial collection or mass lesion/mass effect. Vascular: No hyperdense vessel or unexpected calcification. Skull: Normal. Negative for fracture or focal lesion. Sinuses/Orbits: No acute finding. Other: None. IMPRESSION: No acute intracranial pathology. Electronically Signed   By: Lauralyn Primes M.D.   On: 04/07/2019 17:03    Procedures .Marland KitchenLaceration Repair  Date/Time: 04/07/2019 6:30 PM Performed by: Fayrene Helper, PA-C Authorized by: Fayrene Helper, PA-C   Consent:    Consent obtained:  Verbal   Consent given by:  Patient   Risks discussed:  Infection, need for additional repair, pain, poor cosmetic result and poor wound healing   Alternatives discussed:  No treatment and delayed treatment Universal protocol:    Procedure explained and questions answered to patient or proxy's satisfaction: yes     Relevant documents present and verified: yes     Test results available and properly labeled: yes     Imaging studies available: yes     Required blood products, implants, devices, and special equipment available: yes     Site/side marked: yes     Immediately prior to procedure, a time out was called: yes     Patient identity confirmed:  Verbally with patient Anesthesia (see MAR for exact dosages):    Anesthesia method:  Local infiltration   Local anesthetic:  Lidocaine 2% WITH epi Laceration details:    Location:  Scalp   Scalp location:  R temporal   Length (cm):  4   Depth (mm):  5 Repair type:    Repair type:  Intermediate Pre-procedure details:    Preparation:  Patient was prepped and draped in usual sterile fashion and imaging obtained to evaluate for foreign bodies Exploration:    Hemostasis achieved with:  Direct pressure   Wound exploration: wound explored through full range of motion and  entire depth of wound probed and visualized     Contaminated: no   Treatment:    Area cleansed with:  Saline   Amount of cleaning:  Standard   Irrigation solution:  Sterile saline   Irrigation method:  Pressure wash   Visualized foreign bodies/material removed: no   Skin repair:    Repair method:  Sutures   Suture size:  5-0   Wound skin closure material used: vicryl.   Suture technique:  Simple interrupted   Number of sutures:  11 Post-procedure details:    Dressing:  Open (no dressing)   Patient tolerance of procedure:  Tolerated well, no immediate complications   (including critical care time)  Medications Ordered in ED Medications  lidocaine-EPINEPHrine (XYLOCAINE W/EPI) 2 %-1:200000 (PF) injection 10 mL (10 mLs Intradermal Given 04/07/19 1715)  ibuprofen (ADVIL) tablet 800 mg (800 mg Oral Given 04/07/19 1715)     Initial Impression / Assessment and Plan / ED Course  I have reviewed the triage vital signs and the nursing notes.  Pertinent labs & imaging results that were available during my care of the patient were reviewed by me and considered in my medical decision making (see chart for details).        BP 129/83 (BP Location: Right Arm)   Pulse 73   Temp 98.4 F (36.9 C) (Oral)   Resp 14   Ht 5\' 4"  (1.626 m)   Wt 54.4 kg   LMP 03/31/2019 (Approximate)   SpO2 99%   BMI 20.60 kg/m    Final Clinical Impressions(s) / ED Diagnoses   Final diagnoses:  Minor head injury without loss of consciousness, initial encounter  Laceration of scalp, initial encounter    ED Discharge Orders         Ordered    ibuprofen (ADVIL) 600 MG tablet  Every 6 hours PRN     04/07/19 1828         4:12 PM Pt suffered scalp injury due to the corner of a table struck while she was in the procress to tossing the table into a dumpster.  She does not need head CT based on [place] [facility] CT rule.  She is UTD with tetanus.  Will cleanse wound and perform lac repair.   6:35 PM Pt  had a 4cm lac that was repaired with dissolvable sutures as pt prefers not having to return for staples removal.  Pt tolerates well.  Head CT unremarkable.    Fayrene Helperran, Jermeka Schlotterbeck, PA-C 04/07/19 1839    Little, Ambrose Finlandachel Morgan, MD 04/08/19 [phone]

## 2019-04-07 NOTE — ED Notes (Signed)
Patient verbalizes understanding of discharge instructions. Opportunity for questioning and answers were provided. Armband removed by staff, pt discharged from ED ambulatory to home. Pt also refused vital signs assessment as pt was hurrying to get to transportation.

## 2019-04-07 NOTE — Discharge Instructions (Signed)
You have 11 dissolvable sutures.  Please allow the wound to dry for 24 hrs before washing.  You may continue with normal washing afterward.  Take ibuprofen as needed for pain.  Return if you have any concerns.

## 2019-04-07 NOTE — ED Triage Notes (Signed)
A desk (approx 200lbs) fell onto patient and hit he head; denies LOC. Laceration to right side of head.

## 2019-04-25 ENCOUNTER — Encounter (HOSPITAL_COMMUNITY): Payer: Self-pay | Admitting: Emergency Medicine

## 2019-04-25 ENCOUNTER — Emergency Department (HOSPITAL_COMMUNITY)
Admission: EM | Admit: 2019-04-25 | Discharge: 2019-04-25 | Disposition: A | Discharge status: Home / Self Care | Payer: Self-pay | Attending: Emergency Medicine | Admitting: Emergency Medicine

## 2019-04-25 DIAGNOSIS — H60501 Unspecified acute noninfective otitis externa, right ear: Secondary | ICD-10-CM | POA: Insufficient documentation

## 2019-04-25 DIAGNOSIS — F17210 Nicotine dependence, cigarettes, uncomplicated: Secondary | ICD-10-CM | POA: Insufficient documentation

## 2019-04-25 MED ORDER — OFLOXACIN 0.3 % OT SOLN: 5.0000 [drp] | Freq: Two times a day (BID) | OTIC | 0 refills | Status: AC

## 2019-04-25 NOTE — ED Notes (Signed)
Patient verbalizes understanding of discharge instructions. Opportunity for questioning and answers were provided. Armband removed by staff, pt discharged from ED.  

## 2019-04-25 NOTE — Discharge Instructions (Addendum)
Use eardrops in her ear twice daily as directed.  If infection does not improve please follow-up with ENT.  After infection has resolved you can try making a mix of half rubbing alcohol half white distilled vinegar and putting a few drops of this in your ear daily after showers to help dry out the ear canal and prevent future infections.  Return for new or worsening symptoms.

## 2019-04-25 NOTE — ED Provider Notes (Signed)
[facility] EMERGENCY DEPARTMENT Provider Note   CSN: [id number] Arrival date & time: 04/25/19  1409     History   Chief Complaint Chief Complaint  Patient presents with   Otalgia    HPI Erika Reese is a 38 y.o. female.     Erika Reese is a 38 y.o. female with history of anemia, who presents to the ED for evaluation of right-sided ear pain.  She reports pain has been occurring for about a month, she was hoping it would go away on its own and wanted to avoid coming in.  Reports that she has a history of frequent ear infections in the past, reports she is usually prescribed eardrops and sometimes has to have an ear wick placed.  She reports over the past few days right ear pain has been worsening and she noticed a bump that showed up behind her ear for the first time.  She denies any drainage from the ear or hearing loss.  She occasionally has some mild pain in the left ear but it is nothing in comparison to the right.  She has not taken any medication prior to arrival to treat this pain.  Reports she has not seen an ENT specialist regarding recurrent ear infections.  No other aggravating or alleviating factors.     Past Medical History:  Diagnosis Date   Anemia     There are no active problems to display for this patient.   No past surgical history on file.   OB History   No obstetric history on file.      Home Medications    Prior to Admission medications   Medication Sig Start Date End Date Taking? Authorizing Provider  albuterol (PROVENTIL HFA;VENTOLIN HFA) 108 (90 Base) MCG/ACT inhaler Inhale 1-2 puffs into the lungs every 6 (six) hours as needed for wheezing or shortness of breath. 08/24/18   Hedges, Dellis Filbert, PA-C  amoxicillin-clavulanate (AUGMENTIN) 875-125 MG tablet Take 1 tablet by mouth every 12 (twelve) hours. 06/23/18   Joy, Shawn C, PA-C  ibuprofen (ADVIL) 600 MG tablet Take 1 tablet (600 mg total) by mouth every 6 (six) hours as  needed. 04/07/19   Domenic Moras, PA-C  lidocaine (XYLOCAINE) 2 % solution Use as directed 15 mLs in the mouth or throat as needed for mouth pain. 06/23/18   Joy, Shawn C, PA-C  ofloxacin (FLOXIN) 0.3 % OTIC solution Place 5 drops into the right ear 2 (two) times daily. 04/25/19   Jacqlyn Larsen, PA-C  ondansetron (ZOFRAN ODT) 4 MG disintegrating tablet Take 1 tablet (4 mg total) by mouth every 8 (eight) hours as needed for nausea or vomiting. 06/23/18   Joy, Helane Gunther, PA-C    Family History No family history on file.  Social History Social History   Tobacco Use   Smoking status: Current Every Day Smoker    Packs/day: 0.25    Types: Cigarettes   Smokeless tobacco: Never Used  Substance Use Topics   Alcohol use: Yes   Drug use: No     Allergies   Patient has no known allergies.   Review of Systems Review of Systems  Constitutional: Negative for chills and fever.  HENT: Positive for ear pain. Negative for ear discharge and hearing loss.   Skin: Negative for color change and rash.     Physical Exam Updated Vital Signs BP 129/80 (BP Location: Left Arm)    Pulse 81    Temp 98.2 F (36.8 C) (Oral)  Resp 17    Ht 5\' 4"  (1.626 m)    Wt 54.4 kg    LMP 03/31/2019 (Approximate)    SpO2 100%    BMI 20.60 kg/m   Physical Exam Vitals signs and nursing note reviewed.  Constitutional:      General: She is not in acute distress.    Appearance: Normal appearance. She is well-developed and normal weight. She is not diaphoretic.  HENT:     Head: Normocephalic and atraumatic.     Ears:     Comments: Right external auditory canal erythematous with some edema present, able to visualize TM which is clear with good light reflection, no erythema or bulging, postauricular lymphadenopathy noted, no mastoid swelling or tenderness, no swelling or redness of the auricle. Left external auditory canal is clear, TM normal with good light reflection.    Nose: Nose normal.  Eyes:     General:         Right eye: No discharge.        Left eye: No discharge.  Pulmonary:     Effort: Pulmonary effort is normal. No respiratory distress.  Musculoskeletal:        General: No deformity.  Skin:    General: Skin is warm and dry.  Neurological:     Mental Status: She is alert.     Coordination: Coordination normal.  Psychiatric:        Mood and Affect: Mood normal.        Behavior: Behavior normal.      ED Treatments / Results  Labs (all labs ordered are listed, but only abnormal results are displayed) Labs Reviewed - No data to display  EKG None  Radiology No results found.  Procedures Procedures (including critical care time)  Medications Ordered in ED Medications - No data to display   Initial Impression / Assessment and Plan / ED Course  I have reviewed the triage vital signs and the nursing notes.  Pertinent labs & imaging results that were available during my care of the patient were reviewed by me and considered in my medical decision making (see chart for details).  Exam consistent with right-sided otitis externa, TM is visible, no need for ear wick.  Patient with some postauricular lymphadenopathy but no evidence of mastoiditis or malignant otitis externa.  Will treat with off Floxin drops.  Patient history of recurrent otitis externa, will refer to ENT.  Recommended using rubbing alcohol and distilled white vinegar mixture after infection has resolved to help dry out ears and prevent future recurrence.  Return precautions discussed.  Patient expresses understanding and agreement with plan.  Discharged home in good condition.   Final Clinical Impressions(s) / ED Diagnoses   Final diagnoses:  Acute otitis externa of right ear, unspecified type    ED Discharge Orders         Ordered    ofloxacin (FLOXIN) 0.3 % OTIC solution  2 times daily     04/25/19 1713           14/07/20, Dartha Lodge 04/25/19 1729    14/07/20, MD 04/26/19 1423

## 2019-04-25 NOTE — ED Triage Notes (Signed)
Reports right sided ear pain for over 1 month pt reports recurrent infections in this ear has pain behind ear as well.

## 2019-07-10 ENCOUNTER — Encounter (HOSPITAL_COMMUNITY): Payer: Self-pay | Admitting: Emergency Medicine

## 2019-07-10 ENCOUNTER — Other Ambulatory Visit: Payer: Self-pay

## 2019-07-10 ENCOUNTER — Ambulatory Visit (HOSPITAL_COMMUNITY)
Admission: EM | Admit: 2019-07-10 | Discharge: 2019-07-10 | Disposition: A | Discharge status: Home / Self Care | Payer: Self-pay

## 2019-07-10 DIAGNOSIS — Z803 Family history of malignant neoplasm of breast: Secondary | ICD-10-CM

## 2019-07-10 DIAGNOSIS — N63 Unspecified lump in unspecified breast: Secondary | ICD-10-CM

## 2019-07-10 NOTE — ED Triage Notes (Signed)
Pt noticed a lump in her right breast, on her right side yesterday.  Pt's mother had breast cancer at this same age.

## 2019-07-10 NOTE — ED Provider Notes (Signed)
[facility]    CSN: [id number] Arrival date & time: 07/10/19  1424      History   Chief Complaint Chief Complaint  Patient presents with  . Breast Mass    HPI Erika Reese is a 39 y.o. female.   Erika Reese presents with complaints of palpable changes/ mass to her right breast which she noted last night during her self breast exam. States she performs them every other month. Her mother and grandmother had "benign breast cancer" with her mother having been diagnosed also at age 29. Erika has not had any mammogram in the past. No skin changes. No pain. Does note nipple discharge if squeezed. This is not necessarily new for her, however. Her youngest child is 39. She has not missed a period.    ROS per HPI, negative if not otherwise mentioned.      Past Medical History:  Diagnosis Date  . Anemia     There are no problems to display for this patient.   History reviewed. No pertinent surgical history.  OB History   No obstetric history on file.      Home Medications    Prior to Admission medications   Medication Sig Start Date End Date Taking? Authorizing Provider  albuterol (PROVENTIL HFA;VENTOLIN HFA) 108 (90 Base) MCG/ACT inhaler Inhale 1-2 puffs into the lungs every 6 (six) hours as needed for wheezing or shortness of breath. 08/24/18   Hedges, Tinnie Gens, PA-C  amoxicillin-clavulanate (AUGMENTIN) 875-125 MG tablet Take 1 tablet by mouth every 12 (twelve) hours. 06/23/18   Joy, Shawn C, PA-C  ibuprofen (ADVIL) 600 MG tablet Take 1 tablet (600 mg total) by mouth every 6 (six) hours as needed. 04/07/19   Fayrene Helper, PA-C  lidocaine (XYLOCAINE) 2 % solution Use as directed 15 mLs in the mouth or throat as needed for mouth pain. 06/23/18   Joy, Shawn C, PA-C  ofloxacin (FLOXIN) 0.3 % OTIC solution Place 5 drops into the right ear 2 (two) times daily. 04/25/19   Dartha Lodge, PA-C  ondansetron (ZOFRAN ODT) 4 MG disintegrating tablet Take 1 tablet (4 mg  total) by mouth every 8 (eight) hours as needed for nausea or vomiting. 06/23/18   Anselm Pancoast, PA-C    Family History Family History  Problem Relation Age of Onset  . Cancer Mother        breast  . Healthy Father   . Cancer Sister        ovarian    Social History Social History   Tobacco Use  . Smoking status: Current Every Day Smoker    Packs/day: 0.25    Types: Cigarettes  . Smokeless tobacco: Never Used  Substance Use Topics  . Alcohol use: Yes  . Drug use: No     Allergies   Patient has no known allergies.   Review of Systems Review of Systems   Physical Exam Triage Vital Signs ED Triage Vitals  Enc Vitals Group     BP 07/10/19 1437 (!) 145/94     Pulse Rate 07/10/19 1437 90     Resp 07/10/19 1437 16     Temp 07/10/19 1437 98.2 F (36.8 C)     Temp Source 07/10/19 1437 Oral     SpO2 07/10/19 1437 98 %     Weight --      Height --      Head Circumference --      Peak Flow --      Pain  Score 07/10/19 1439 0     Pain Loc --      Pain Edu? --      Excl. in [place]? --    No data found.  Updated Vital Signs BP (!) 145/94 (BP Location: Left Arm)   Pulse 90   Temp 98.2 F (36.8 C) (Oral)   Resp 16   LMP 06/13/2019 (Approximate)   SpO2 98%   Visual Acuity Right Eye Distance:   Left Eye Distance:   Bilateral Distance:    Right Eye Near:   Left Eye Near:    Bilateral Near:     Physical Exam Constitutional:      General: She is not in acute distress.    Appearance: She is well-developed.  Cardiovascular:     Rate and Rhythm: Normal rate.  Pulmonary:     Effort: Pulmonary effort is normal.  Chest:     Breasts:        Right: Mass present. No swelling, bleeding, nipple discharge or tenderness.        Left: Normal.       Comments: Right lateral breast with approximately 1cm fibrous mass noted, more prominent with patient side lying; non tender, mobile; no redness warmth swelling; no visible changes to breast Lymphadenopathy:     Upper Body:      Right upper body: No axillary adenopathy.     Left upper body: No axillary adenopathy.  Skin:    General: Skin is warm and dry.  Neurological:     Mental Status: She is alert and oriented to person, place, and time.      UC Treatments / Results  Labs (all labs ordered are listed, but only abnormal results are displayed) Labs Reviewed - No data to display  EKG   Radiology No results found.  Procedures Procedures (including critical care time)  Medications Ordered in UC Medications - No data to display  Initial Impression / Assessment and Plan / UC Course  I have reviewed the triage vital signs and the nursing notes.  Pertinent labs & imaging results that were available during my care of the patient were reviewed by me and considered in my medical decision making (see chart for details).    Breasts are overall somewhat fibrous in nature, with palpable mass to right lateral breast. With breast cancer history opted to provide order for imaging to further evaluate this. Discussed changes in breast tissue with cycles etc. Encouraged establish with PCP for health maintenance. Patient verbalized understanding and agreeable to plan.   Final Clinical Impressions(s) / UC Diagnoses   Final diagnoses:  Breast lump in female  Family history of breast cancer     Discharge Instructions     With this new finding I have sent in an order for imaging to further evaluate your breasts.  You will get a call to set up appointment time.  If you do not hear by the end of the week you can call us to confirm order went through etc.  I do recommend establishing with a primary care provider for regular health maintenance.     ED Prescriptions    None     PDMP not reviewed this encounter.   Zigmund Gottron, NP 07/10/19 2046

## 2019-07-10 NOTE — Discharge Instructions (Signed)
With this new finding I have sent in an order for imaging to further evaluate your breasts.  You will get a call to set up appointment time.  If you do not hear by the end of the week you can call us to confirm order went through etc.  I do recommend establishing with a primary care provider for regular health maintenance.

## 2019-07-13 ENCOUNTER — Other Ambulatory Visit: Payer: Self-pay | Admitting: Emergency Medicine

## 2019-07-13 ENCOUNTER — Other Ambulatory Visit: Payer: Self-pay

## 2019-07-13 DIAGNOSIS — N6310 Unspecified lump in the right breast, unspecified quadrant: Secondary | ICD-10-CM

## 2019-07-28 ENCOUNTER — Ambulatory Visit
Admission: RE | Admit: 2019-07-28 | Discharge: 2019-07-28 | Disposition: A | Discharge status: Home / Self Care | Payer: No Typology Code available for payment source | Source: Ambulatory Visit | Attending: Obstetrics and Gynecology | Admitting: Obstetrics and Gynecology

## 2019-07-28 ENCOUNTER — Ambulatory Visit: Payer: Self-pay | Admitting: Student

## 2019-07-28 ENCOUNTER — Other Ambulatory Visit: Payer: Self-pay

## 2019-07-28 VITALS — BP 110/72 | Temp 98.4°F | Wt 121.0 lb

## 2019-07-28 DIAGNOSIS — Z124 Encounter for screening for malignant neoplasm of cervix: Secondary | ICD-10-CM

## 2019-07-28 DIAGNOSIS — N6310 Unspecified lump in the right breast, unspecified quadrant: Secondary | ICD-10-CM

## 2019-07-28 NOTE — Progress Notes (Signed)
Ms. Raylynne Cubbage is a 39 y.o. No obstetric history on file. female who presents to [facility] clinic today with right lump and pain for two weeks.  No discharge. She has a sister who was recently diagnosed with cervix and has been successfully treated.    Pap Smear: Pap smear completed today. Last Pap smear was 3 years ago and was normal per patient.    Physical exam: Breasts Breasts symmetrical. No skin abnormalities bilateral breasts. No nipple retraction bilateral breasts. No nipple discharge bilateral breasts. No lymphadenopathy. No lumps palpated bilateral breasts.       Pelvic/Bimanual Ext Genitalia No lesions, no swelling and no discharge observed on external genitalia.        Vagina Vagina pink and normal texture. No lesions or discharge observed in vagina.        Cervix Cervix is present. Cervix pink and of normal texture. No discharge observed.    Uterus Uterus is present and palpable. Uterus in normal position and normal size.        Adnexae Bilateral ovaries present and palpable. No tenderness on palpation.         Rectovaginal No rectal exam completed today since patient had no rectal complaints. No skin abnormalities observed on exam.     Smoking History: Patient has never smoked    Patient Navigation: Patient education provided. Access to services provided for patient through [facility] program.  Colorectal Cancer Screening: Per patient has never had colonoscopy completed.  No complaints today.    Breast and Cervical Cancer Risk Assessment: Patient does not have family history of breast cancer, known genetic mutations, or radiation treatment to the chest before age 63. Patient does not have history of cervical dysplasia, immunocompromised, or DES exposure in-utero.  Risk Assessment    Risk Scores      07/28/2019   Last edited by: Narda Rutherford, LPN   5-year risk: 1.8 %   Lifetime risk: 37.2 %          A: [facility] exam with pap smear Complaint of right breast  lump with pain for two weeks.   P: Referred patient to [facility] for a diagnostic mammogram. Appointment scheduled 3/11 at 0320.  Marylene Land, CNM 07/28/2019 2:29 PM

## 2019-07-29 LAB — CYTOLOGY - PAP
Comment: NEGATIVE
Diagnosis: NEGATIVE
High risk HPV: NEGATIVE

## 2019-08-08 ENCOUNTER — Telehealth: Payer: Self-pay

## 2019-08-08 NOTE — Telephone Encounter (Signed)
Normal pap letter mailed

## 2019-08-11 ENCOUNTER — Ambulatory Visit: Payer: Self-pay

## 2020-02-07 ENCOUNTER — Ambulatory Visit: Payer: No Typology Code available for payment source

## 2020-05-21 ENCOUNTER — Other Ambulatory Visit: Payer: No Typology Code available for payment source

## 2020-05-21 DIAGNOSIS — Z20822 Contact with and (suspected) exposure to covid-19: Secondary | ICD-10-CM

## 2020-05-22 LAB — SARS-COV-2, NAA 2 DAY TAT

## 2020-05-22 LAB — NOVEL CORONAVIRUS, NAA: SARS-CoV-2, NAA: NOT DETECTED

## 2020-06-05 ENCOUNTER — Other Ambulatory Visit: Payer: No Typology Code available for payment source

## 2020-07-06 ENCOUNTER — Telehealth: Payer: Self-pay

## 2020-07-06 NOTE — Telephone Encounter (Signed)
Telephoned patient at home number. No answer, and unable to leave a voice message from [facility].

## 2020-09-17 ENCOUNTER — Other Ambulatory Visit: Payer: Self-pay

## 2020-09-17 ENCOUNTER — Emergency Department (HOSPITAL_COMMUNITY)
Admission: EM | Admit: 2020-09-17 | Discharge: 2020-09-17 | Disposition: A | Discharge status: Home / Self Care | Payer: No Typology Code available for payment source | Attending: Emergency Medicine | Admitting: Emergency Medicine

## 2020-09-17 ENCOUNTER — Encounter (HOSPITAL_COMMUNITY): Payer: Self-pay

## 2020-09-17 DIAGNOSIS — K0889 Other specified disorders of teeth and supporting structures: Secondary | ICD-10-CM | POA: Insufficient documentation

## 2020-09-17 DIAGNOSIS — F17290 Nicotine dependence, other tobacco product, uncomplicated: Secondary | ICD-10-CM | POA: Insufficient documentation

## 2020-09-17 MED ORDER — AMOXICILLIN 500 MG PO CAPS: 500.0000 mg | ORAL_CAPSULE | Freq: Three times a day (TID) | ORAL | 0 refills | Status: AC

## 2020-09-17 MED ORDER — CHLORHEXIDINE GLUCONATE 0.12 % MT SOLN: 15.0000 mL | Freq: Two times a day (BID) | OROMUCOSAL | 0 refills | Status: AC

## 2020-09-17 NOTE — ED Provider Notes (Signed)
[facility] EMERGENCY DEPARTMENT Provider Note   CSN: [id number] Arrival date & time: 09/17/20  1122     History Chief Complaint  Patient presents with  . Dental Pain    Erika Reese is a 40 y.o. female.  Patient presents complaint of left-sided dental pain.  She states he had it in the past several months ago and then it resolved.  She had recurrence again about 4 days ago describes as aching pain in the left premolar posterior molar on the left upper jaw region.  Denies any trouble breathing or difficulty swallowing.  No fevers no cough no vomiting no diarrhea.        Past Medical History:  Diagnosis Date  . Anemia     There are no problems to display for this patient.   Past Surgical History:  Procedure Laterality Date  . TUBAL LIGATION       OB History   No obstetric history on file.     Family History  Problem Relation Age of Onset  . Cancer Mother        breast  . Healthy Father   . Cancer Sister        ovarian    Social History   Tobacco Use  . Smoking status: Current Every Day Smoker    Types: Cigars  . Smokeless tobacco: Never Used  . Tobacco comment: 1 cigar per day  Vaping Use  . Vaping Use: Never used  Substance Use Topics  . Alcohol use: Never  . Drug use: No    Home Medications Prior to Admission medications   Medication Sig Start Date End Date Taking? Authorizing Provider  amoxicillin (AMOXIL) 500 MG capsule Take 1 capsule (500 mg total) by mouth 3 (three) times daily. 09/17/20  Yes Mackensie Pilson, Eustace Moore, MD  chlorhexidine (PERIDEX) 0.12 % solution Use as directed 15 mLs in the mouth or throat 2 (two) times daily. 09/17/20  Yes Cheryll Cockayne, MD  albuterol (PROVENTIL HFA;VENTOLIN HFA) 108 (90 Base) MCG/ACT inhaler Inhale 1-2 puffs into the lungs every 6 (six) hours as needed for wheezing or shortness of breath. 08/24/18   Hedges, Tinnie Gens, PA-C  amoxicillin-clavulanate (AUGMENTIN) 875-125 MG tablet Take 1 tablet by mouth  every 12 (twelve) hours. 06/23/18   Joy, Shawn C, PA-C  ibuprofen (ADVIL) 600 MG tablet Take 1 tablet (600 mg total) by mouth every 6 (six) hours as needed. 04/07/19   Fayrene Helper, PA-C  lidocaine (XYLOCAINE) 2 % solution Use as directed 15 mLs in the mouth or throat as needed for mouth pain. 06/23/18   Joy, Shawn C, PA-C  ofloxacin (FLOXIN) 0.3 % OTIC solution Place 5 drops into the right ear 2 (two) times daily. 04/25/19   Dartha Lodge, PA-C  ondansetron (ZOFRAN ODT) 4 MG disintegrating tablet Take 1 tablet (4 mg total) by mouth every 8 (eight) hours as needed for nausea or vomiting. 06/23/18   Joy, Ines Bloomer C, PA-C    Allergies    Patient has no known allergies.  Review of Systems   Review of Systems  Constitutional: Negative for fever.  HENT: Negative for ear pain.   Eyes: Negative for pain.  Respiratory: Negative for cough.   Cardiovascular: Negative for chest pain.  Gastrointestinal: Negative for abdominal pain.  Genitourinary: Negative for flank pain.  Musculoskeletal: Negative for back pain.  Skin: Negative for rash.  Neurological: Negative for headaches.    Physical Exam Updated Vital Signs BP (!) 135/94 (BP Location: Right  Arm)   Pulse 80   Temp 98.2 F (36.8 C) (Oral)   Resp 17   Ht 5\' 4"  (1.626 m)   Wt 54.4 kg   SpO2 100%   BMI 20.60 kg/m   Physical Exam Constitutional:      General: She is not in acute distress.    Appearance: Normal appearance.  HENT:     Head: Normocephalic.     Comments: No noticeable facial cellulitis or swelling noted.    Nose: Nose normal.     Mouth/Throat:     Comments: Mild tenderness in the left outside premolar gumline.  No palpable abscess or swelling noted. Eyes:     Extraocular Movements: Extraocular movements intact.  Cardiovascular:     Rate and Rhythm: Normal rate.  Pulmonary:     Effort: Pulmonary effort is normal.  Musculoskeletal:        General: Normal range of motion.     Cervical back: Normal range of motion.   Neurological:     General: No focal deficit present.     Mental Status: She is alert. Mental status is at baseline.     ED Results / Procedures / Treatments   Labs (all labs ordered are listed, but only abnormal results are displayed) Labs Reviewed - No data to display  EKG None  Radiology No results found.  Procedures Procedures   Medications Ordered in ED Medications - No data to display  ED Course  I have reviewed the triage vital signs and the nursing notes.  Pertinent labs & imaging results that were available during my care of the patient were reviewed by me and considered in my medical decision making (see chart for details).    MDM Rules/Calculators/A&P                          Patient is clinically well-appearing.  Recommending dental follow-up within the week.  We will give a prescription of antibiotics and Peridex solution.  Recommending the patient follow-up with dentistry within the week, advised immediate return for worsening symptoms fevers pain or any additional concerns. oral solution.  Advised return for facial swelling difficulty breathing worsening pain fevers or any additional concerns.  Final Clinical Impression(s) / ED Diagnoses Final diagnoses:  Pain, dental    Rx / DC Orders ED Discharge Orders         Ordered    amoxicillin (AMOXIL) 500 MG capsule  3 times daily        09/17/20 1301    chlorhexidine (PERIDEX) 0.12 % solution  2 times daily        09/17/20 1301           11/17/20, MD 09/17/20 1301

## 2020-09-17 NOTE — Discharge Instructions (Signed)
Call your primary care doctor or specialist as discussed in the next 2-3 days.   Return immediately back to the ER if:  Your symptoms worsen within the next 12-24 hours. You develop new symptoms such as new fevers, persistent vomiting, new pain, shortness of breath, or new weakness or numbness, or if you have any other concerns.  

## 2020-09-17 NOTE — ED Triage Notes (Signed)
Patient arrives to the ED for evaluation for left sided mouth pain that started 4 days ago.  Patient states that the pain is in her gums on both top and bottom.  States that it feels swollen. Patient states that she hasn't been able to eat and it hurts to talk.

## 2020-10-31 ENCOUNTER — Other Ambulatory Visit: Payer: Self-pay

## 2020-10-31 ENCOUNTER — Emergency Department (HOSPITAL_COMMUNITY)
Admission: EM | Admit: 2020-10-31 | Discharge: 2020-10-31 | Disposition: A | Discharge status: Home / Self Care | Payer: No Typology Code available for payment source | Attending: Emergency Medicine | Admitting: Emergency Medicine

## 2020-10-31 ENCOUNTER — Encounter (HOSPITAL_COMMUNITY): Payer: Self-pay | Admitting: Pharmacy Technician

## 2020-10-31 DIAGNOSIS — F17290 Nicotine dependence, other tobacco product, uncomplicated: Secondary | ICD-10-CM | POA: Insufficient documentation

## 2020-10-31 DIAGNOSIS — K047 Periapical abscess without sinus: Secondary | ICD-10-CM | POA: Insufficient documentation

## 2020-10-31 DIAGNOSIS — K0889 Other specified disorders of teeth and supporting structures: Secondary | ICD-10-CM

## 2020-10-31 MED ORDER — ONDANSETRON 4 MG PO TBDP: 4.0000 mg | ORAL_TABLET | Freq: Three times a day (TID) | ORAL | 0 refills | Status: DC | PRN

## 2020-10-31 MED ORDER — CHLORHEXIDINE GLUCONATE 0.12 % MT SOLN: 15.0000 mL | Freq: Two times a day (BID) | OROMUCOSAL | 0 refills | Status: AC

## 2020-10-31 MED ORDER — CLINDAMYCIN HCL 300 MG PO CAPS: 300.0000 mg | ORAL_CAPSULE | Freq: Four times a day (QID) | ORAL | 0 refills | Status: AC

## 2020-10-31 NOTE — ED Notes (Signed)
Patient Alert and oriented to baseline. Stable and ambulatory to baseline. Patient verbalized understanding of the discharge instructions.  Patient belongings were taken by the patient.   

## 2020-10-31 NOTE — ED Triage Notes (Signed)
Pt here with reports of LU dental pain. Pt with some swelling noted.

## 2020-10-31 NOTE — Discharge Instructions (Addendum)
Referral given to dentist, also review resource guide, consider follow up with [facility]. Antibiotics as prescribed and complete the full course.

## 2020-10-31 NOTE — ED Provider Notes (Signed)
[facility] EMERGENCY DEPARTMENT Provider Note   CSN: [id number] Arrival date & time: 10/31/20  3762     History Chief Complaint  Patient presents with   Dental Pain    Erika Reese is a 40 y.o. female.  40 year old female presents with ongoing left upper dental pain.  Seen here last month for same, took antibiotics and pain improved however has been unable to follow-up with a dentist.  Denies trauma, fever, drainage.  No other complaints or concerns today.      Past Medical History:  Diagnosis Date   Anemia     There are no problems to display for this patient.   Past Surgical History:  Procedure Laterality Date   TUBAL LIGATION       OB History   No obstetric history on file.     Family History  Problem Relation Age of Onset   Cancer Mother        breast   Healthy Father    Cancer Sister        ovarian    Social History   Tobacco Use   Smoking status: Every Day    Pack years: 0.00    Types: Cigars   Smokeless tobacco: Never   Tobacco comments:    1 cigar per day  Vaping Use   Vaping Use: Never used  Substance Use Topics   Alcohol use: Never   Drug use: No    Home Medications Prior to Admission medications   Medication Sig Start Date End Date Taking? Authorizing Provider  chlorhexidine (PERIDEX) 0.12 % solution Use as directed 15 mLs in the mouth or throat 2 (two) times daily. 10/31/20  Yes Jeannie Fend, PA-C  clindamycin (CLEOCIN) 300 MG capsule Take 1 capsule (300 mg total) by mouth 4 (four) times daily for 7 days. 10/31/20 11/07/20 Yes Jeannie Fend, PA-C  ondansetron (ZOFRAN ODT) 4 MG disintegrating tablet Take 1 tablet (4 mg total) by mouth every 8 (eight) hours as needed for nausea or vomiting. 10/31/20  Yes Jeannie Fend, PA-C  albuterol (PROVENTIL HFA;VENTOLIN HFA) 108 (90 Base) MCG/ACT inhaler Inhale 1-2 puffs into the lungs every 6 (six) hours as needed for wheezing or shortness of breath. 08/24/18   Hedges,  Tinnie Gens, PA-C  amoxicillin (AMOXIL) 500 MG capsule Take 1 capsule (500 mg total) by mouth 3 (three) times daily. 09/17/20   Cheryll Cockayne, MD  amoxicillin-clavulanate (AUGMENTIN) 875-125 MG tablet Take 1 tablet by mouth every 12 (twelve) hours. 06/23/18   Joy, Shawn C, PA-C  chlorhexidine (PERIDEX) 0.12 % solution Use as directed 15 mLs in the mouth or throat 2 (two) times daily. 09/17/20   Cheryll Cockayne, MD  ibuprofen (ADVIL) 600 MG tablet Take 1 tablet (600 mg total) by mouth every 6 (six) hours as needed. 04/07/19   Fayrene Helper, PA-C  lidocaine (XYLOCAINE) 2 % solution Use as directed 15 mLs in the mouth or throat as needed for mouth pain. 06/23/18   Joy, Shawn C, PA-C  ofloxacin (FLOXIN) 0.3 % OTIC solution Place 5 drops into the right ear 2 (two) times daily. 04/25/19   Dartha Lodge, PA-C  ondansetron (ZOFRAN ODT) 4 MG disintegrating tablet Take 1 tablet (4 mg total) by mouth every 8 (eight) hours as needed for nausea or vomiting. 06/23/18   Joy, Ines Bloomer C, PA-C    Allergies    Patient has no known allergies.  Review of Systems   Review of Systems  Constitutional:  Negative for chills and fever.  HENT:  Positive for dental problem and facial swelling. Negative for trouble swallowing and voice change.   Gastrointestinal:  Negative for vomiting.  Musculoskeletal:  Negative for neck pain and neck stiffness.  Skin:  Negative for rash and wound.  Neurological:  Negative for headaches.  Hematological:  Negative for adenopathy.  Psychiatric/Behavioral:  Negative for confusion.   All other systems reviewed and are negative.  Physical Exam Updated Vital Signs BP 125/81   Pulse 86   Temp 98.1 F (36.7 C)   Resp 20   SpO2 96%   Physical Exam Vitals and nursing note reviewed.  Constitutional:      General: She is not in acute distress.    Appearance: She is well-developed. She is not diaphoretic.  HENT:     Head: Atraumatic.     Comments: No trismus.  Slight left maxillary swelling without  overlying erythema, swelling of left upper gingiva without drainable collection.    Mouth/Throat:     Mouth: Mucous membranes are moist.  Eyes:     Conjunctiva/sclera: Conjunctivae normal.  Pulmonary:     Effort: Pulmonary effort is normal.  Musculoskeletal:     Cervical back: Neck supple.  Lymphadenopathy:     Cervical: No cervical adenopathy.  Skin:    General: Skin is warm and dry.  Neurological:     Mental Status: She is alert and oriented to person, place, and time.  Psychiatric:        Behavior: Behavior normal.    ED Results / Procedures / Treatments   Labs (all labs ordered are listed, but only abnormal results are displayed) Labs Reviewed - No data to display  EKG None  Radiology No results found.  Procedures Procedures   Medications Ordered in ED Medications - No data to display  ED Course  I have reviewed the triage vital signs and the nursing notes.  Pertinent labs & imaging results that were available during my care of the patient were reviewed by me and considered in my medical decision making (see chart for details).  Clinical Course as of 10/31/20 0943  Wed Oct 31, 2020  1849 40 year old female with left upper dental pain, found to have swelling of left upper gingiva without drainable abscess.  Plan is to treat with antibiotics, also given Zofran and Peridex solution.  Referral to local dentist with resource guide. [LM]    Clinical Course User Index [LM] Alden Hipp   MDM Rules/Calculators/A&P                           Final Clinical Impression(s) / ED Diagnoses Final diagnoses:  Pain, dental  Dental abscess    Rx / DC Orders ED Discharge Orders          Ordered    clindamycin (CLEOCIN) 300 MG capsule  4 times daily        10/31/20 0939    chlorhexidine (PERIDEX) 0.12 % solution  2 times daily        10/31/20 0939    ondansetron (ZOFRAN ODT) 4 MG disintegrating tablet  Every 8 hours PRN        10/31/20 0939              Jeannie Fend, PA-C 10/31/20 5329    Arby Barrette, MD 11/05/20 [phone]

## 2020-12-24 ENCOUNTER — Other Ambulatory Visit: Payer: Self-pay

## 2020-12-24 DIAGNOSIS — N6310 Unspecified lump in the right breast, unspecified quadrant: Secondary | ICD-10-CM

## 2020-12-25 ENCOUNTER — Ambulatory Visit: Payer: No Typology Code available for payment source

## 2020-12-27 ENCOUNTER — Ambulatory Visit: Payer: Self-pay | Admitting: *Deleted

## 2020-12-27 ENCOUNTER — Other Ambulatory Visit: Payer: Self-pay

## 2020-12-27 VITALS — BP 92/70 | Wt 122.6 lb

## 2020-12-27 DIAGNOSIS — N6311 Unspecified lump in the right breast, upper outer quadrant: Secondary | ICD-10-CM

## 2020-12-27 DIAGNOSIS — Z1239 Encounter for other screening for malignant neoplasm of breast: Secondary | ICD-10-CM

## 2020-12-27 NOTE — Progress Notes (Signed)
Erika Reese is a 40 y.o. female who presents to [facility] clinic today with complaint of right breast lump x 4 months that is tender to the touch. Patient rates the pain at a 1-2 out of 10..    Pap Smear: Pap smear not completed today. Last Pap smear was 07/28/2019 at [facility] clinic and was normal with negative HPV. Per patient has no history of an abnormal Pap smear. Last Pap smear result is available in Epic.   Physical exam: Breasts Breasts symmetrical. No skin abnormalities bilateral breasts. No nipple retraction bilateral breasts. No nipple discharge bilateral breasts. No lymphadenopathy. No lumps palpated left breast. Palpated two lumps within the right breast a pea sized lump at 10 o'clock 9 cm from the nipple and a mobile lump at 10 o'clock 7 cm from the nipple. Complaints of left outer breast tenderness on exam.  MS DIGITAL DIAG TOMO BILAT  Result Date: 07/28/2019 CLINICAL DATA:  Palpable abnormality in the RIGHT breast. Patient reports her mother was diagnosed with breast cancer at age 33 but did not require treatment. EXAM: DIGITAL DIAGNOSTIC BILATERAL MAMMOGRAM WITH CAD AND TOMO ULTRASOUND RIGHT BREAST COMPARISON:  Baseline evaluation ACR Breast Density Category d: The breast tissue is extremely dense, which lowers the sensitivity of mammography. FINDINGS: No suspicious mass, distortion, or microcalcifications are identified to suggest presence of malignancy. Spot tangential view in the area concern in the UPPER-OUTER QUADRANT of the RIGHT breast demonstrates extremely dense fibroglandular tissue. Mammographic images were processed with CAD. On physical exam, I palpate mobile soft thickening without discrete mass in the UPPER-OUTER QUADRANT of the RIGHT breast. Targeted ultrasound is performed, showing normal appearing, extremely dense fibroglandular tissue in the UPPER-OUTER QUADRANT of the RIGHT breast. No suspicious mass, distortion, or acoustic shadowing is demonstrated with ultrasound.  IMPRESSION: No mammographic or ultrasound evidence for malignancy. RECOMMENDATION: Screening mammogram in one year.(Code:[id number]-B-01Y) I have discussed the findings and recommendations with the patient. If applicable, a reminder letter will be sent to the patient regarding the next appointment. BI-RADS CATEGORY  2: Benign. Electronically Signed   By: Norva Pavlov M.D.   On: 07/28/2019 16:23    Pelvic/Bimanual Pap is not indicated today per BCCCP guidelines.   Smoking History: Patient currently vapes. Discussed smoking cessation with patient. Referred to the [facility] Quitline and gave resources to the free smoking cessation classes at [facility].   Patient Navigation: Patient education provided. Access to services provided for patient through BCCCP program.    Breast and Cervical Cancer Risk Assessment: Patient does not have family history of breast cancer, known genetic mutations, or radiation treatment to the chest before age 46. Patient does not have history of cervical dysplasia, immunocompromised, or DES exposure in-utero.  Risk Assessment     Risk Scores       12/27/2020 07/28/2019   Last edited by: Meryl Dare, CMA McGill, Sherie Demetrius Charity, LPN   5-year risk: 2 % 1.8 %   Lifetime risk: 37.1 % 37.2 %            A: BCCCP exam without pap smear Complaint of right breast lump and pain.  P: Referred patient to the [facility] for a diagnostic mammogram. Appointment scheduled Tuesday, January 01, 2021 at 1310.  Priscille Heidelberg, RN 12/27/2020 9:28 AM

## 2020-12-27 NOTE — Patient Instructions (Signed)
Explained breast self awareness with [name]. Patient did not need a Pap smear today due to last Pap smear and HPV typing was 07/28/2019. Let her know BCCCP will cover Pap smears and HPV typing every 5 years unless has a history of abnormal Pap smears. Referred patient to the [facility] for a diagnostic mammogram. Appointment scheduled Tuesday, January 01, 2021 at 1310. Patient aware of appointment and will be there. Discussed smoking cessation with patient. Referred to the [facility] Quitline and gave resources to the free smoking cessation classes at [facility]. [place] Picchi verbalized understanding.  Revella Shelton, Kathaleen Maser, RN 9:28 AM

## 2021-01-01 ENCOUNTER — Ambulatory Visit
Admission: RE | Admit: 2021-01-01 | Discharge: 2021-01-01 | Disposition: A | Discharge status: Home / Self Care | Payer: No Typology Code available for payment source | Source: Ambulatory Visit | Attending: Obstetrics and Gynecology | Admitting: Obstetrics and Gynecology

## 2021-01-01 ENCOUNTER — Other Ambulatory Visit: Payer: Self-pay

## 2021-01-01 DIAGNOSIS — N6310 Unspecified lump in the right breast, unspecified quadrant: Secondary | ICD-10-CM

## 2023-03-03 IMAGING — MG DIGITAL DIAGNOSTIC BILAT W/ TOMO W/ CAD
6 of 12 series · 6 of 36 positions shown · non-contrast
Comparison: Previous exam(s).

CLINICAL DATA: Patient complains of a palpable abnormality in the
right breast at 10 o'clock 7 cm from the nipple and her physician
palpated an abnormality at 10 o'clock 8 cm from the nipple. Patient
states that the thickening at 10 o'clock 7 cm from the nipple is
increasing in size.

EXAM:
DIGITAL DIAGNOSTIC BILATERAL MAMMOGRAM WITH TOMOSYNTHESIS AND CAD;
ULTRASOUND RIGHT BREAST LIMITED
TECHNIQUE: Bilateral digital diagnostic mammography and breast tomosynthesis
was performed. The images were evaluated with computer-aided
detection.; Targeted ultrasound examination of the right breast was
performed

[L CC synth-2D]
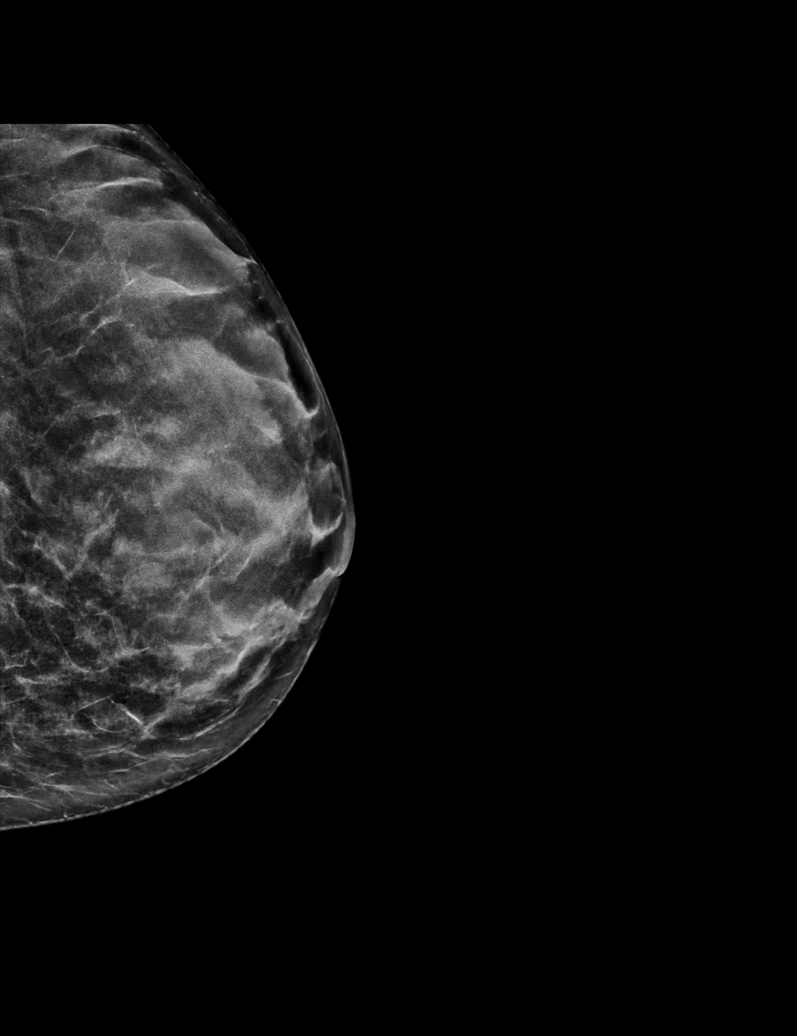

[R MLO synth-2D (1 of 2)]
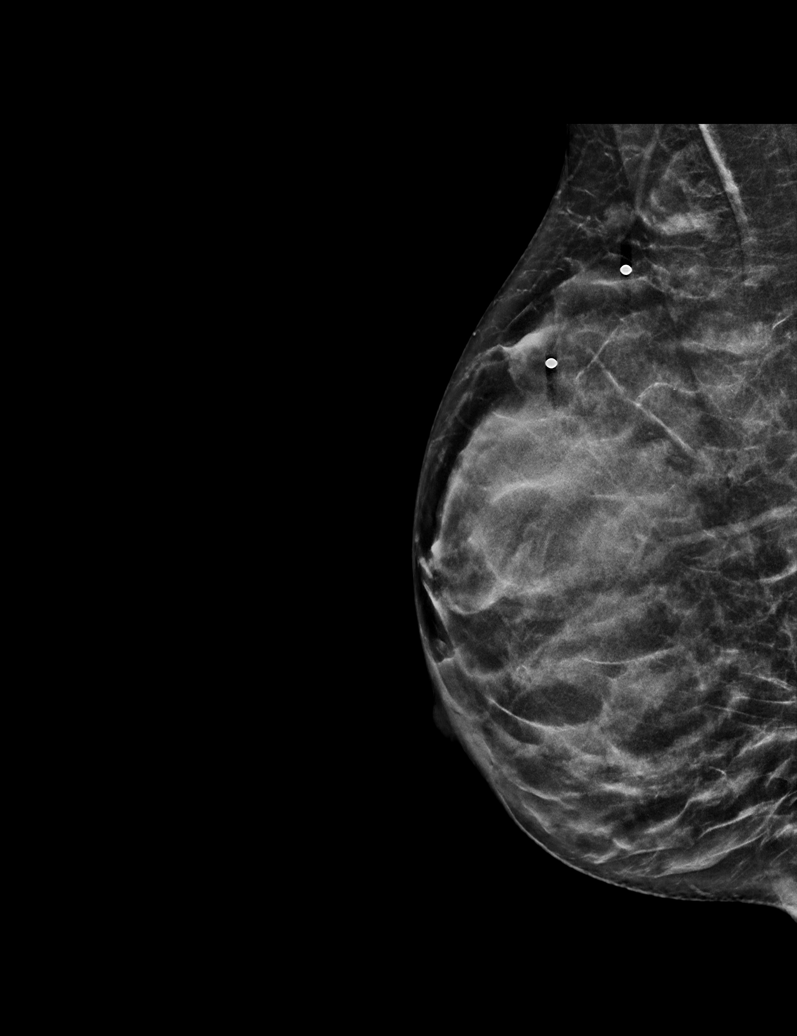

[R TAN synth-2D]
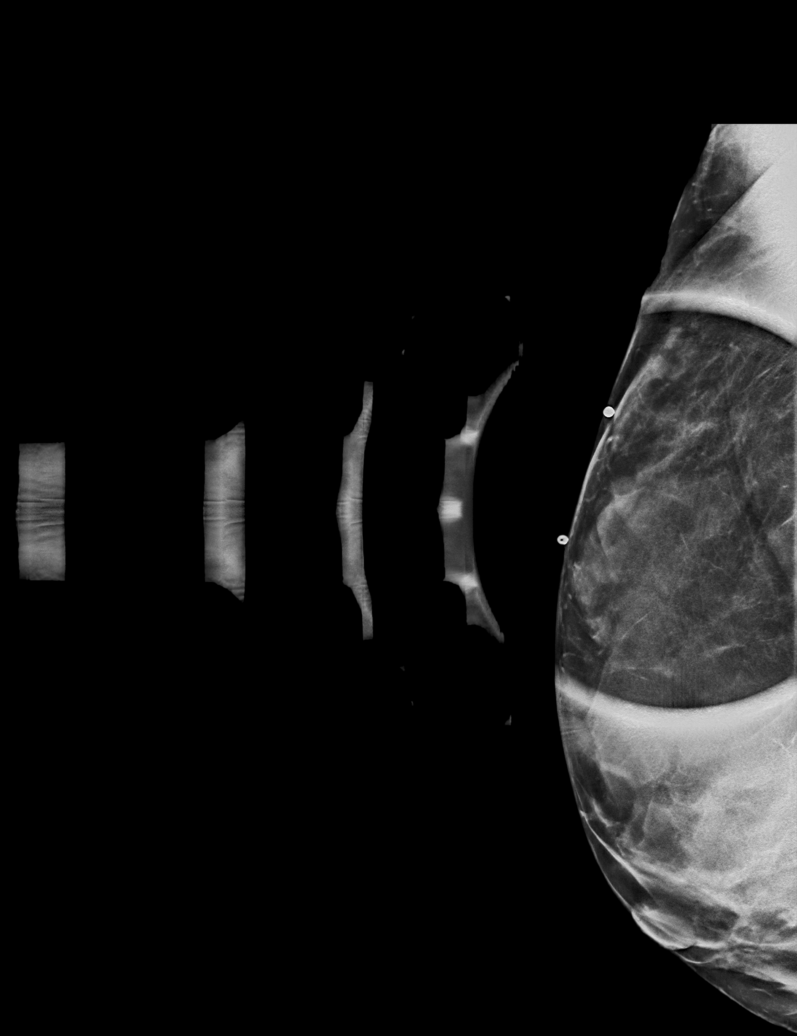

[L MLO synth-2D]
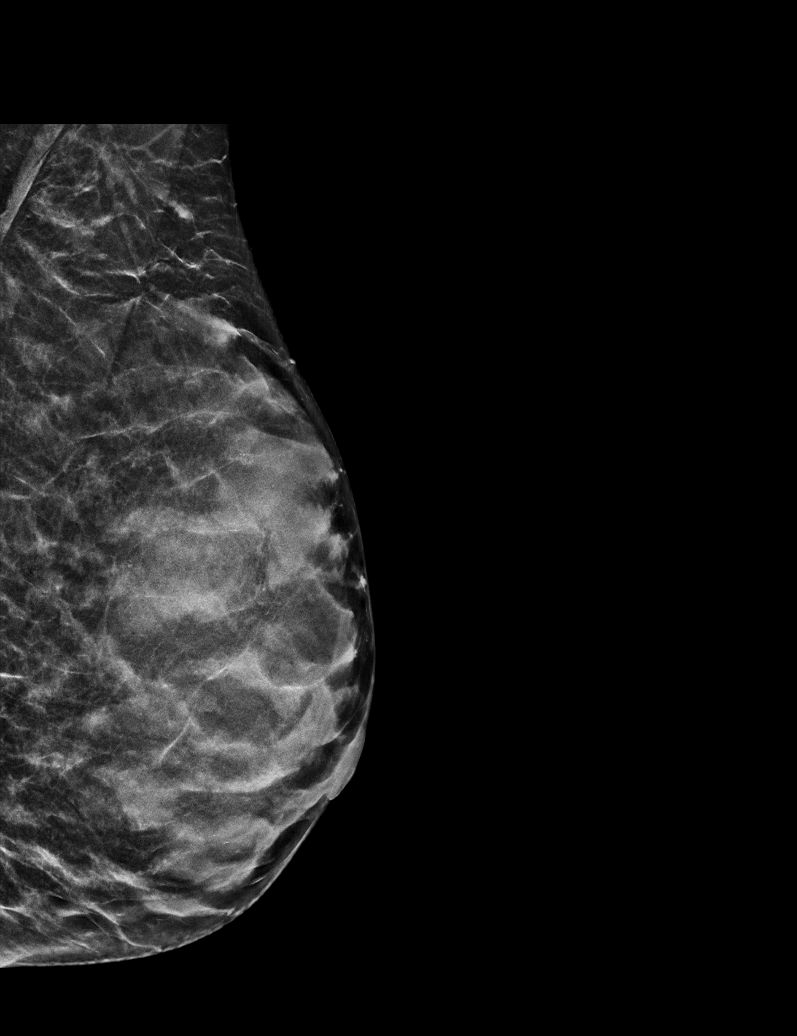

[R CC synth-2D]
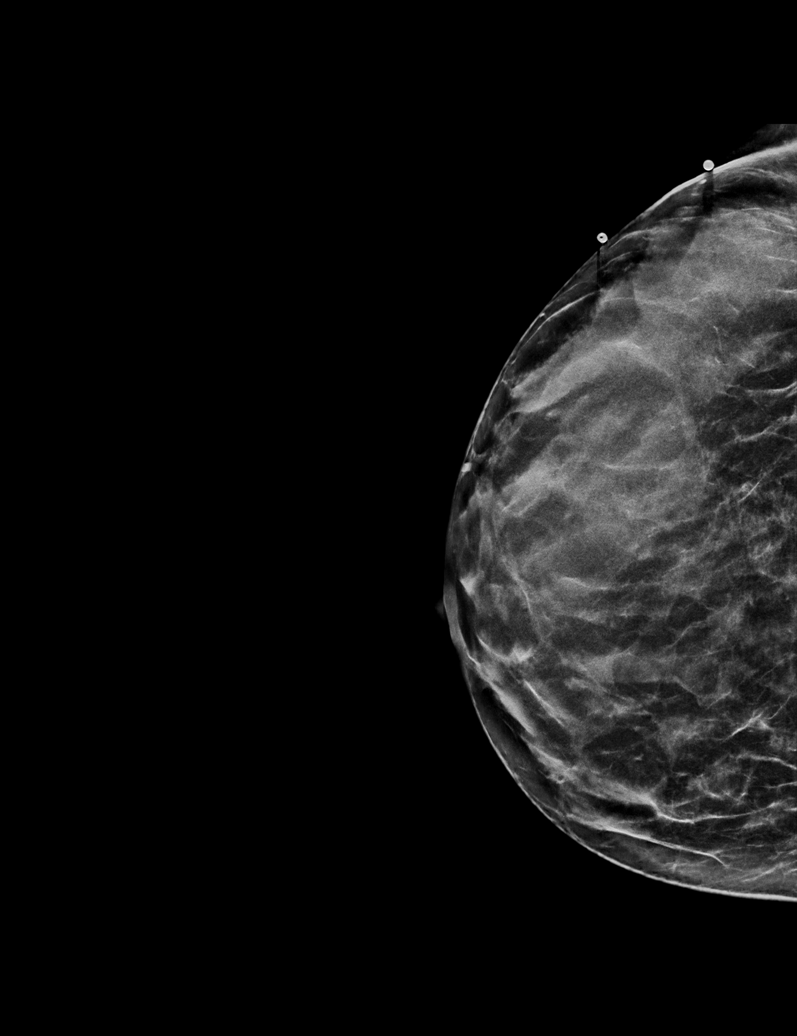

[R MLO synth-2D (2 of 2)]
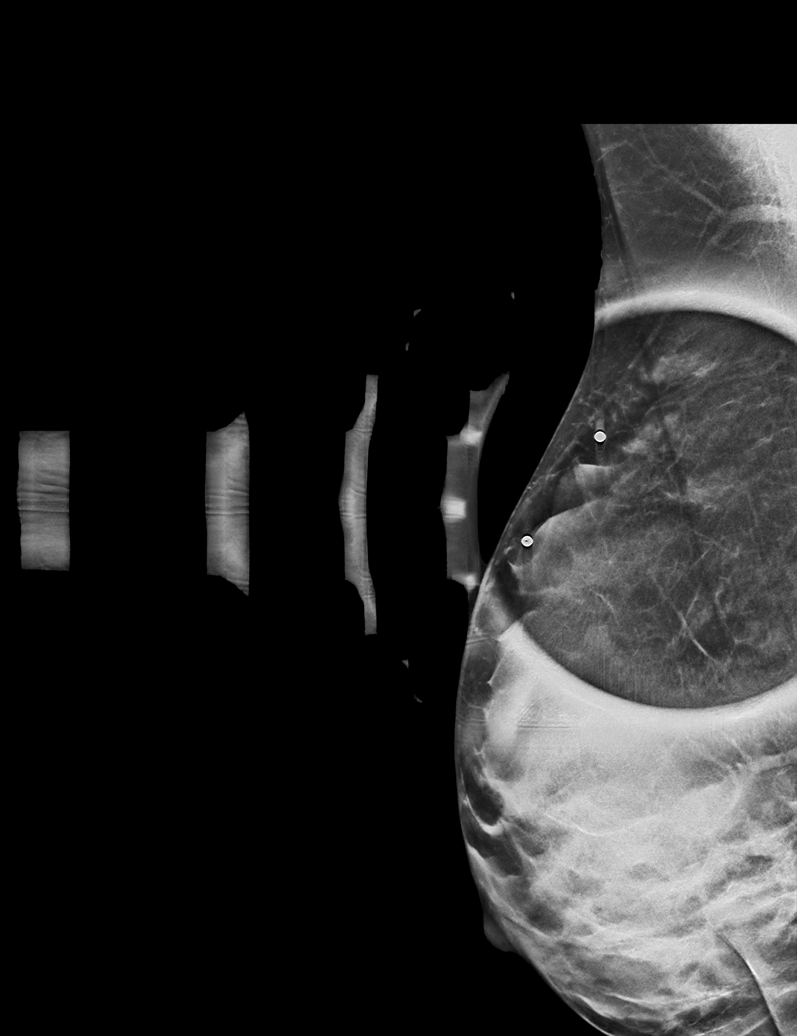

[6 of 36 positions shown; findings below may reference images not displayed]

ACR Breast Density Category d: The breast tissue is extremely dense,
which lowers the sensitivity of mammography.
FINDINGS: No suspicious mass, malignant type microcalcifications or distortion
detected in either breast. Spot tangential views of the areas of
clinical concern show extremely dense fibroglandular tissue with no
discrete mass.

On physical exam, there is soft thickening in the 10 o'clock region
of the right breast at 10 o'clock 7-8 cm from the nipple. I do not
palpate a discrete mass.

Targeted ultrasound is performed, showing normal tissue in the areas
of clinical concern in the right breast at 10 o'clock 7 and 8 cm
from the nipple. No solid or cystic mass, abnormal shadowing or
distortion visualized.
IMPRESSION: No mammographic evidence of malignancy in either breast. No
sonographic evidence of malignancy in the area of clinical concern
in the 10 o'clock region of the right breast.

RECOMMENDATION:
If the clinical exam remains benign/stable screening mammography can
be deferred until the age of 40.

The patient has extremely dense fibroglandular tissue and she feels
the area of thickening in the 10 o'clock region of the right breast
is more pronounced. Further evaluation with MRI would be recommended
if clinically warranted. The importance of self-breast examination
was discussed with the patient.

I have discussed the findings and recommendations with the patient.
If applicable, a reminder letter will be sent to the patient
regarding the next appointment.

BI-RADS CATEGORY  1: Negative.

## 2023-08-12 ENCOUNTER — Other Ambulatory Visit: Payer: Self-pay

## 2023-08-12 ENCOUNTER — Encounter (HOSPITAL_COMMUNITY): Payer: Self-pay

## 2023-08-12 ENCOUNTER — Emergency Department (HOSPITAL_COMMUNITY)
Admission: EM | Admit: 2023-08-12 | Discharge: 2023-08-12 | Disposition: A | Discharge status: Home / Self Care | Attending: Emergency Medicine | Admitting: Emergency Medicine

## 2023-08-12 DIAGNOSIS — A630 Anogenital (venereal) warts: Secondary | ICD-10-CM | POA: Insufficient documentation

## 2023-08-12 DIAGNOSIS — Z79899 Other long term (current) drug therapy: Secondary | ICD-10-CM | POA: Insufficient documentation

## 2023-08-12 DIAGNOSIS — K645 Perianal venous thrombosis: Secondary | ICD-10-CM | POA: Insufficient documentation

## 2023-08-12 MED ORDER — HYDROCORTISONE (PERIANAL) 2.5 % EX CREA: 1.0000 | TOPICAL_CREAM | Freq: Two times a day (BID) | CUTANEOUS | 0 refills | Status: AC

## 2023-08-12 MED ORDER — LIDOCAINE-EPINEPHRINE (PF) 2 %-1:200000 IJ SOLN
20.0000 mL | Freq: Once | INTRAMUSCULAR | Status: AC
  Administered 2023-08-12: 20 mL
  Filled 2023-08-12: qty 20

## 2023-08-12 MED ORDER — HYDROCODONE-ACETAMINOPHEN 5-325 MG PO TABS
1.0000 | ORAL_TABLET | Freq: Once | ORAL | Status: AC
  Administered 2023-08-12: 1 via ORAL
  Filled 2023-08-12: qty 1

## 2023-08-12 NOTE — Discharge Instructions (Signed)
 Please continue to take sitz bath's daily, monitor your condition carefully and do not hesitate to return here for concerning changes in your condition.  Otherwise follow-up with our surgery colleagues.

## 2023-08-12 NOTE — ED Notes (Signed)
 Patient verbalizes understanding of discharge instructions. Opportunity for questioning and answers were provided. Pt discharged from ED.

## 2023-08-12 NOTE — ED Triage Notes (Signed)
 Pt states she has had a particular hemorrhoid for awhile that haven't bother her in the past. Within the last three days it has became more painful and purplish. Pt states her last BM was Friday. She feels the BM made it worse d/t constipation.

## 2023-08-12 NOTE — ED Provider Notes (Signed)
 [facility] EMERGENCY DEPARTMENT AT [facility] Provider Note   CSN: [id number] Arrival date & time: 08/12/23  1038     History  Chief Complaint  Patient presents with   Hemorrhoids    Erika Reese is a 43 y.o. female.  HPI Presents with rectal pain.  Patient notes a history of hemorrhoids, typically managed with creams, sitz bath's.  However, over the past 3 days she has had worsening pain in her rectum, inability to defecate secondary to pain in that area, no vomiting, no fever, chest pain or other complaints. No relief with multiple OTC attempts at therapy.    Home Medications Prior to Admission medications   Medication Sig Start Date End Date Taking? Authorizing Provider  hydrocortisone (ANUSOL-HC) 2.5 % rectal cream Place 1 Application rectally 2 (two) times daily. 08/12/23  Yes Gerhard Munch, MD  albuterol (PROVENTIL HFA;VENTOLIN HFA) 108 (90 Base) MCG/ACT inhaler Inhale 1-2 puffs into the lungs every 6 (six) hours as needed for wheezing or shortness of breath. Patient not taking: Reported on 12/27/2020 08/24/18   Hedges, Tinnie Gens, PA-C  amoxicillin (AMOXIL) 500 MG capsule Take 1 capsule (500 mg total) by mouth 3 (three) times daily. Patient not taking: Reported on 12/27/2020 09/17/20   Cheryll Cockayne, MD  amoxicillin-clavulanate (AUGMENTIN) 875-125 MG tablet Take 1 tablet by mouth every 12 (twelve) hours. Patient not taking: Reported on 12/27/2020 06/23/18   Anselm Pancoast, PA-C  chlorhexidine (PERIDEX) 0.12 % solution Use as directed 15 mLs in the mouth or throat 2 (two) times daily. Patient not taking: Reported on 12/27/2020 09/17/20   Cheryll Cockayne, MD  chlorhexidine (PERIDEX) 0.12 % solution Use as directed 15 mLs in the mouth or throat 2 (two) times daily. Patient not taking: Reported on 12/27/2020 10/31/20   Army Melia A, PA-C  ibuprofen (ADVIL) 600 MG tablet Take 1 tablet (600 mg total) by mouth every 6 (six) hours as needed. Patient not taking: Reported on  12/27/2020 04/07/19   Fayrene Helper, PA-C  lidocaine (XYLOCAINE) 2 % solution Use as directed 15 mLs in the mouth or throat as needed for mouth pain. 06/23/18   Joy, Shawn C, PA-C  ofloxacin (FLOXIN) 0.3 % OTIC solution Place 5 drops into the right ear 2 (two) times daily. Patient not taking: Reported on 12/27/2020 04/25/19   Rosezella Rumpf, PA-C  ondansetron (ZOFRAN ODT) 4 MG disintegrating tablet Take 1 tablet (4 mg total) by mouth every 8 (eight) hours as needed for nausea or vomiting. Patient not taking: Reported on 12/27/2020 06/23/18   Joy, Shawn C, PA-C  ondansetron (ZOFRAN ODT) 4 MG disintegrating tablet Take 1 tablet (4 mg total) by mouth every 8 (eight) hours as needed for nausea or vomiting. Patient not taking: Reported on 12/27/2020 10/31/20   Jeannie Fend, PA-C      Allergies    Patient has no known allergies.    Review of Systems   Review of Systems  Physical Exam Updated Vital Signs BP 105/68   Pulse 68   Temp 98.3 F (36.8 C)   Resp 18   Ht 5\' 4"  (1.626 m)   Wt 54.4 kg   LMP 06/29/2023 (Approximate)   SpO2 100%   BMI 20.60 kg/m  Physical Exam Vitals and nursing note reviewed. Exam conducted with a chaperone present.  Constitutional:      General: She is not in acute distress.    Appearance: She is well-developed.  HENT:     Head: Normocephalic and  atraumatic.  Eyes:     Conjunctiva/sclera: Conjunctivae normal.  Cardiovascular:     Rate and Rhythm: Normal rate and regular rhythm.  Pulmonary:     Effort: Pulmonary effort is normal. No respiratory distress.     Breath sounds: Normal breath sounds. No stridor.  Abdominal:     General: There is no distension.  Genitourinary:   Skin:    General: Skin is warm and dry.  Neurological:     Mental Status: She is alert and oriented to person, place, and time.     Cranial Nerves: No cranial nerve deficit.  Psychiatric:        Mood and Affect: Mood normal.     ED Results / Procedures / Treatments   Labs (all  labs ordered are listed, but only abnormal results are displayed) Labs Reviewed - No data to display  EKG None  Radiology No results found.  Procedures .Incision and Drainage  Date/Time: 08/12/2023 1:52 PM  Performed by: Gerhard Munch, MD Authorized by: Gerhard Munch, MD   Consent:    Consent obtained:  Verbal   Consent given by:  Patient   Risks, benefits, and alternatives were discussed: yes     Risks discussed:  Bleeding and pain   Alternatives discussed:  Alternative treatment Universal protocol:    Immediately prior to procedure, a time out was called: yes     Patient identity confirmed:  Verbally with patient Location:    Type:  External thrombosed hemorrhoid   Size:  5cm   Location:  Anogenital   Anogenital location:  Rectum Pre-procedure details:    Skin preparation:  Chlorhexidine with alcohol Sedation:    Sedation type:  None Anesthesia:    Anesthesia method:  Local infiltration (Vicodin)   Local anesthetic:  Lidocaine 2% WITH epi Procedure type:    Complexity:  Complex Procedure details:    Ultrasound guidance: no     Needle aspiration: no     Incision types:  Elliptical   Wound management:  Probed and deloculated and extensive cleaning   Drainage characteristics: Multiple clots of blood.   Wound treatment:  Wound left open Post-procedure details:    Procedure completion:  Tolerated     Medications Ordered in ED Medications  HYDROcodone-acetaminophen (NORCO/VICODIN) 5-325 MG per tablet 1 tablet (1 tablet Oral Given 08/12/23 1221)  lidocaine-EPINEPHrine (XYLOCAINE W/EPI) 2 %-1:200000 (PF) injection 20 mL (20 mLs Infiltration Given by Other 08/12/23 1332)    ED Course/ Medical Decision Making/ A&P                                 Medical Decision Making Patient presents with rectal pain, concern for thrombosed hemorrhoid and on exam this is identified as well as condyloma.  Patient has no evidence for systemic disease, no other complaints.   After consent was obtained patient had incision and drainage of thrombosed hemorrhoid with marked improvement in her condition.  Patient will follow-up with surgery.   1:56 PM Procedure well-tolerated        Final Clinical Impression(s) / ED Diagnoses Final diagnoses:  Thrombosed external hemorrhoid  Anal condyloma    Rx / DC Orders ED Discharge Orders          Ordered    hydrocortisone (ANUSOL-HC) 2.5 % rectal cream  2 times daily        08/12/23 1356  Gerhard Munch, MD 08/12/23 1357

## 2024-02-27 ENCOUNTER — Encounter (HOSPITAL_COMMUNITY): Payer: Self-pay | Admitting: Pharmacy Technician

## 2024-02-27 ENCOUNTER — Emergency Department (HOSPITAL_COMMUNITY)
Admission: EM | Admit: 2024-02-27 | Discharge: 2024-02-27 | Disposition: A | Discharge status: Home / Self Care | Attending: Emergency Medicine | Admitting: Emergency Medicine

## 2024-02-27 ENCOUNTER — Emergency Department (HOSPITAL_COMMUNITY)

## 2024-02-27 ENCOUNTER — Other Ambulatory Visit: Payer: Self-pay

## 2024-02-27 DIAGNOSIS — J069 Acute upper respiratory infection, unspecified: Secondary | ICD-10-CM | POA: Insufficient documentation

## 2024-02-27 LAB — RESP PANEL BY RT-PCR (RSV, FLU A&B, COVID)  RVPGX2
Influenza A by PCR: NEGATIVE
Influenza B by PCR: NEGATIVE
Resp Syncytial Virus by PCR: NEGATIVE
SARS Coronavirus 2 by RT PCR: NEGATIVE

## 2024-02-27 MED ORDER — ONDANSETRON 4 MG PO TBDP: ORAL_TABLET | ORAL | 0 refills | Status: AC

## 2024-02-27 MED ORDER — BENZONATATE 100 MG PO CAPS: 100.0000 mg | ORAL_CAPSULE | Freq: Three times a day (TID) | ORAL | 0 refills | Status: AC

## 2024-02-27 NOTE — ED Provider Notes (Signed)
 [place] EMERGENCY DEPARTMENT AT [facility] Provider Note   CSN: [id number] Arrival date & time: 02/27/24  1217     Patient presents with: Cough   Erika Reese is a 42 y.o. female.   43 yo F with a cc of a cough. Going on for about a week or so.  Patient feeling better.  Told by her boss that she needs to go to the hospital to be testing for covid and flu.  Decreased apetite.  No difficulty breathing.  6 mo old at home with similar illness.    Cough      Prior to Admission medications   Medication Sig Start Date End Date Taking? Authorizing Provider  benzonatate  (TESSALON ) 100 MG capsule Take 1 capsule (100 mg total) by mouth every 8 (eight) hours. 02/27/24  Yes Emil Share, DO  ondansetron  (ZOFRAN -ODT) 4 MG disintegrating tablet 4mg  ODT q4 hours prn nausea/vomit 02/27/24  Yes Eero Dini, DO  albuterol  (PROVENTIL  HFA;VENTOLIN  HFA) 108 (90 Base) MCG/ACT inhaler Inhale 1-2 puffs into the lungs every 6 (six) hours as needed for wheezing or shortness of breath. Patient not taking: Reported on 12/27/2020 08/24/18   Hedges, Reyes, PA-C  amoxicillin  (AMOXIL ) 500 MG capsule Take 1 capsule (500 mg total) by mouth 3 (three) times daily. Patient not taking: Reported on 12/27/2020 09/17/20   Phebe Fonda RAMAN, MD  amoxicillin -clavulanate (AUGMENTIN ) 875-125 MG tablet Take 1 tablet by mouth every 12 (twelve) hours. Patient not taking: Reported on 12/27/2020 06/23/18   Joy, Elouise BROCKS, PA-C  chlorhexidine  (PERIDEX ) 0.12 % solution Use as directed 15 mLs in the mouth or throat 2 (two) times daily. Patient not taking: Reported on 12/27/2020 09/17/20   Phebe Fonda RAMAN, MD  chlorhexidine  (PERIDEX ) 0.12 % solution Use as directed 15 mLs in the mouth or throat 2 (two) times daily. Patient not taking: Reported on 12/27/2020 10/31/20   Beverley Doffing A, PA-C  hydrocortisone  (ANUSOL -HC) 2.5 % rectal cream Place 1 Application rectally 2 (two) times daily. 08/12/23   Garrick Charleston, MD  ibuprofen  (ADVIL )  600 MG tablet Take 1 tablet (600 mg total) by mouth every 6 (six) hours as needed. Patient not taking: Reported on 12/27/2020 04/07/19   Nivia Colon, PA-C  lidocaine  (XYLOCAINE ) 2 % solution Use as directed 15 mLs in the mouth or throat as needed for mouth pain. 06/23/18   Joy, Shawn C, PA-C  ofloxacin  (FLOXIN ) 0.3 % OTIC solution Place 5 drops into the right ear 2 (two) times daily. Patient not taking: Reported on 12/27/2020 04/25/19   Kehrli, Kelsey F, PA-C    Allergies: Patient has no known allergies.    Review of Systems  Respiratory:  Positive for cough.     Updated Vital Signs BP 115/80 (BP Location: Right Arm)   Pulse (!) 56   Temp 97.8 F (36.6 C) (Oral)   Resp 16   SpO2 100%   Physical Exam Vitals and nursing note reviewed.  Constitutional:      General: She is not in acute distress.    Appearance: She is well-developed. She is not diaphoretic.  HENT:     Head: Normocephalic and atraumatic.     Comments: Swollen turbinates posterior nasal drip Eyes:     Pupils: Pupils are equal, round, and reactive to light.  Cardiovascular:     Rate and Rhythm: Normal rate and regular rhythm.     Heart sounds: No murmur heard.    No friction rub. No gallop.  Pulmonary:  Effort: Pulmonary effort is normal.     Breath sounds: No wheezing or rales.  Abdominal:     General: There is no distension.     Palpations: Abdomen is soft.     Tenderness: There is no abdominal tenderness.  Musculoskeletal:        General: No tenderness.     Cervical back: Normal range of motion and neck supple.  Skin:    General: Skin is warm and dry.  Neurological:     Mental Status: She is alert and oriented to person, place, and time.  Psychiatric:        Behavior: Behavior normal.     (all labs ordered are listed, but only abnormal results are displayed) Labs Reviewed  RESP PANEL BY RT-PCR (RSV, FLU A&B, COVID)  [id number]    EKG: None  Radiology: DG Chest 2 View Result Date:  02/27/2024 CLINICAL DATA:  Several day history of fever, cough, congestion EXAM: DG CHEST 2V COMPARISON:  Chest radiograph dated 01/08/2016 FINDINGS: Normal lung volumes. No focal consolidations. No pleural effusion or pneumothorax. The heart size and mediastinal contours are within normal limits. No acute osseous abnormality. IMPRESSION: No active cardiopulmonary disease. Electronically Signed   By: Limin  Xu M.D.   On: 02/27/2024 12:56     Procedures   Medications Ordered in the ED - No data to display                                  Medical Decision Making Amount and/or Complexity of Data Reviewed Radiology: ordered.  Risk Prescription drug management.   43 yo F with a chief complaints of cough congestion going on for about a week.  Actually is getting a little bit better now.  She has a family member who is also sick with a similar illness.  She initially told me she was here because she did not want to get her kids sick later in the encounter she told me that her boss had told her she had to get checked out to make sure she does not have COVID or the flu.  I discussed with her the limited utility of knowing if she had COVID or flu after she had started to feel better.  I discussed typical therapy for upper respiratory illness.  I encouraged her to follow-up with her primary care doctor in their office.  She did have COVID flu RSV performed through the triage process.  Negative.  Chest x-ray independently interpreted by me without focal infiltrate or pneumothorax.  3:52 PM:  I have discussed the diagnosis/risks/treatment options with the patient.  Evaluation and diagnostic testing in the emergency department does not suggest an emergent condition requiring admission or immediate intervention beyond what has been performed at this time.  They will follow up with PCP. We also discussed returning to the ED immediately if new or worsening sx occur. We discussed the sx which are most  concerning (e.g., sudden worsening pain, fever, inability to tolerate by mouth) that necessitate immediate return. Medications administered to the patient during their visit and any new prescriptions provided to the patient are listed below.  Medications given during this visit Medications - No data to display   The patient appears reasonably screen and/or stabilized for discharge and I doubt any other medical condition or other [facility] requiring further screening, evaluation, or treatment in the ED at this time prior to discharge.  Final diagnoses:  Viral URI with cough    ED Discharge Orders          Ordered    benzonatate  (TESSALON ) 100 MG capsule  Every 8 hours        02/27/24 1538    ondansetron  (ZOFRAN -ODT) 4 MG disintegrating tablet        02/27/24 1538               Emil Share, DO 02/27/24 1552

## 2024-02-27 NOTE — ED Provider Triage Note (Signed)
 Emergency Medicine Provider Triage Evaluation Note  Erika Reese , a 43 y.o. female  was evaluated in triage.  Pt complains of cough, congestion. Sx x 1 week. Taking Mucinex with some improvement.  Patient's children are home sick with similar symptoms.  Review of Systems  Positive:  Negative:   Physical Exam  BP 117/82   Pulse 77   Temp 98.5 F (36.9 C)   Resp 16   SpO2 98%  Gen:   Awake, no distress   Resp:  Normal effort  MSK:   Moves extremities without difficulty  Other:    Medical Decision Making  Medically screening exam initiated at 1:22 PM.  Appropriate orders placed.  Akhila Mahnken was informed that the remainder of the evaluation will be completed by another provider, this initial triage assessment does not replace that evaluation, and the importance of remaining in the ED until their evaluation is complete.     Nora Lauraine LABOR, PA-C 02/27/24 1323

## 2024-02-27 NOTE — Discharge Instructions (Signed)
 Take tylenol 2 pills 4 times a day and motrin 4 pills 3 times a day.  Drink plenty of fluids.  Return for worsening shortness of breath, headache, confusion. Follow up with your family doctor.

## 2024-02-27 NOTE — ED Triage Notes (Signed)
 Pt c/o cough, congestion, low grade fever for several days.
